# Patient Record
Sex: Male | Born: 1990 | Race: White | Hispanic: No | Marital: Single | Smoking: Current every day smoker
Health system: Southern US, Community
[De-identification: ages and names within clinical notes are randomized; demographics above are authoritative.]

## PROBLEM LIST (undated history)

## (undated) HISTORY — PX: APPENDECTOMY: SHX54

---
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
## 2010-06-15 ENCOUNTER — Emergency Department (HOSPITAL_COMMUNITY): Admission: EM | Admit: 2010-06-15 | Discharge: 2010-06-15 | Payer: Self-pay | Admitting: Family Medicine

## 2010-07-16 ENCOUNTER — Emergency Department (HOSPITAL_COMMUNITY)
Admission: EM | Admit: 2010-07-16 | Discharge: 2010-07-16 | Payer: Self-pay | Source: Home / Self Care | Admitting: Emergency Medicine

## 2010-12-24 NOTE — Discharge Summary (Signed)
Dennis Foley, Dennis Foley              ACCOUNT NO.:  [id number]   MEDICAL RECORD NO.:  [id number]          PATIENT TYPE:  INP   LOCATION:  6121                         FACILITY:  [facility]   PHYSICIAN:  Prabhakar D. Pendse, M.D.DATE OF BIRTH:  01/30/1991   DATE OF ADMISSION:  07/27/2005  DATE OF DISCHARGE:  07/29/2005                                 DISCHARGE SUMMARY   CHIEF COMPLAINT:  Acute appendicitis.   HOSPITAL COURSE:  Herbert is a 20 year old with no significant past medical  history who was referred from an Urgent [facility] Center for concern for acute  appendicitis.  The patient complained of right-sided abdominal pain and  anorexia that had worsened over the past three days prior to admission.  His  white blood cell count was 14.4, with 75% segs.  He went to the operating  room for an exploratory laparotomy and was found to have a nonperforated  acute appendicitis and received two days of Unasyn IV antibiotics  postoperatively.  Prior to discharge, the patient was afebrile, tolerating  p.o. pain medications as well as a p.o. diet and was advanced to a full  diet.   PROCEDURE:  Exploratory laparotomy with appendectomy on July 27, 2005.   FINAL DIAGNOSIS:  Acute nonperforated appendicitis.   DISCHARGE MEDICATIONS:  Tylenol No. 3, one to two tablets p.o. q.6h. p.r.n.  pain.  Given #12 with no refills.   DISCHARGE INSTRUCTIONS:  The patient's family was instructed to return to  the emergency department for purulent drainage from incision site, fever  greater than 102, or any other concerns.   FOLLOW UP:  They were to follow up with Dr. Levie Heritage in three weeks and given  the number [phone] to call to schedule that appointment secondary to the  office being closed today.   DISCHARGE DATA:  Discharge weight 62.2 kg.   CONDITION ON DISCHARGE:  Good.     ______________________________  Pediatrics Resident    ______________________________  Hyman Bible. Levie Heritage, M.D.    PR/MEDQ  D:  07/29/2005  T:  08/01/2005  Job:  [id number]   cc:   Donnella Bi D. Pendse, M.D.  Fax: [phone]

## 2010-12-24 NOTE — Op Note (Signed)
NAMEJAKOBI, Dennis Foley              ACCOUNT NO.:  [id number]   MEDICAL RECORD NO.:  [id number]          PATIENT TYPE:  OBV   LOCATION:  2550                         FACILITY:  [facility]   PHYSICIAN:  Prabhakar D. Pendse, M.D.DATE OF BIRTH:  10-03-1990   DATE OF PROCEDURE:  07/27/2005  DATE OF DISCHARGE:                                 OPERATIVE REPORT   PREOPERATIVE DIAGNOSIS:  Acute appendicitis.   POSTOPERATIVE DIAGNOSIS:  Acute appendicitis without gross perforation.   OPERATION PERFORMED:  Exploratory laparotomy and appendectomy.   SURGEON:  Prabhakar D. Levie Heritage, M.D.   ASSISTANT:  Nurse.   ANESTHESIA:  Nurse.   OPERATIVE INDICATIONS:  This 20 year old boy was referred by Dr. Coralee Rud from [facility] on [address] with  about 60 hours history of progressively worse right-sided abdominal pains  associated with anorexia.  There was no history of vomiting, no URI.   Physical examination was consistent with localizing right lower quadrant  tenderness with rebound.  The white count was 14,400 with 75% neutrophils.  Urinalysis was normal.  Clinical diagnosis of acute appendicitis was made.  The patient was taken to the operating room.   OPERATIVE FINDINGS:  Upon opening the peritoneal cavity, there was small  quantity of straw-colored fluid in the right lower quadrant area.  Appendix  itself was about 4 inches long, markedly distended, indurated and omentum  wrapped around the tip of the appendix. There was no evidence of gross  perforation.  Limited examination of the cecum and the terminal ileum showed  no other abnormalities.   OPERATIVE PROCEDURE:  Under satisfactory general endotracheal anesthesia,  the patient in supine position, abdomen was thoroughly prepped and draped in  the usual manner. About 2 inch long transverse incision was made in the  right lower quadrant area.  Skin and subcutaneous tissue incised. Bleeders  individually  clamped, cut and electrocoagulated.  Muscles incised in the  McBurney fashion, peritoneal cavity entered. The findings were as described  above. At this time, by digital exploration, appendix was exteriorized  partially.  Appendicular attachments were serially clamped, cut and ligated  with 2-0 silk.  Appendiceal base was clearly identified.  Appendectomy done  in the routine fashion. The stump was buried in the cecal wall with 3-0 silk  pursestring suture. Hemostasis was accomplished.  The area was irrigated.  Sponge and needle count being correct. Peritoneum closed with 2-0 Vicryl  running interlocking sutures. Wound was irrigated again.  Muscles closed  with 2-0 Vicryl interrupted sutures,  subcutaneous tissue with 2-0 Vicryl, skin closed with 4-0 Monocryl  subcuticular sutures. Steri-Strips applied. Appropriate dressing applied.  Throughout the procedure the patient's vital signs remained stable. The  patient withstood the procedure well and was transferred to recovery room in  satisfactory general condition.           ______________________________  Hyman Bible Levie Heritage, M.D.     PDP/MEDQ  D:  07/27/2005  T:  07/29/2005  Job:  [id number]   cc:   Ricka Burdock, P.A.-C.  [facility]   David L. Reed Breech,  M.D.  Fax: [phone]

## 2011-02-02 ENCOUNTER — Inpatient Hospital Stay (INDEPENDENT_AMBULATORY_CARE_PROVIDER_SITE_OTHER)
Admission: RE | Admit: 2011-02-02 | Discharge: 2011-02-02 | Disposition: A | Discharge status: Home / Self Care | Payer: 59 | Source: Ambulatory Visit | Attending: Family Medicine | Admitting: Family Medicine

## 2011-02-02 DIAGNOSIS — J029 Acute pharyngitis, unspecified: Secondary | ICD-10-CM

## 2011-07-21 ENCOUNTER — Encounter: Payer: Self-pay | Admitting: Emergency Medicine

## 2011-07-21 ENCOUNTER — Emergency Department (INDEPENDENT_AMBULATORY_CARE_PROVIDER_SITE_OTHER)
Admission: EM | Admit: 2011-07-21 | Discharge: 2011-07-21 | Disposition: A | Discharge status: Home / Self Care | Payer: 59 | Source: Home / Self Care | Attending: Family Medicine | Admitting: Family Medicine

## 2011-07-21 DIAGNOSIS — J029 Acute pharyngitis, unspecified: Secondary | ICD-10-CM

## 2011-07-21 DIAGNOSIS — R05 Cough: Secondary | ICD-10-CM

## 2011-07-21 DIAGNOSIS — R059 Cough, unspecified: Secondary | ICD-10-CM

## 2011-07-21 LAB — POCT RAPID STREP A: Streptococcus, Group A Screen (Direct): NEGATIVE

## 2011-07-21 MED ORDER — GUAIFENESIN-CODEINE 100-10 MG/5ML PO SYRP: 5.0000 mL | ORAL_SOLUTION | Freq: Four times a day (QID) | ORAL | Status: AC | PRN

## 2011-07-21 NOTE — ED Notes (Signed)
Pt here with cold/cough sx that started x 1 wk ago.sx cough with green mocous,chest congestion and itchy throat.pt has not tried otc meds.no fevers reported

## 2011-07-21 NOTE — ED Provider Notes (Signed)
History     CSN: [id number] Arrival date & time: 07/21/2011 10:25 AM   First MD Initiated Contact with Patient 07/21/11 1038      Chief Complaint  Patient presents with  . URI    (Consider location/radiation/quality/duration/timing/severity/associated sxs/prior treatment) HPI Comments: Dennis Foley presents for evaluation of persistent cough and sore throat. He denies fever but reports hx of bronchitis last year and a remote hx of pneumonia. He smokes several cigarettes daily.   Patient is a 20 y.o. male presenting with cough. The history is provided by the patient.  Cough This is a new problem. The current episode started more than 2 days ago. The problem occurs constantly. The problem has not changed since onset.The cough is non-productive. There has been no fever. Associated symptoms include sore throat and myalgias. He has tried nothing for the symptoms. He is a smoker. His past medical history is significant for bronchitis and pneumonia.    History reviewed. No pertinent past medical history.  Past Surgical History  Procedure Date  . Appendectomy     History reviewed. No pertinent family history.  History  Substance Use Topics  . Smoking status: Current Everyday Smoker  . Smokeless tobacco: Not on file  . Alcohol Use: Yes      Review of Systems  Constitutional: Negative for fever.  HENT: Positive for sore throat.   Eyes: Negative.   Respiratory: Positive for cough.   Cardiovascular: Negative.   Gastrointestinal: Negative.   Genitourinary: Negative.   Musculoskeletal: Positive for myalgias.  Skin: Negative.   Neurological: Negative.     Allergies  Review of patient's allergies indicates no known allergies.  Home Medications   Current Outpatient Rx  Name Route Sig Dispense Refill  . GUAIFENESIN-CODEINE 100-10 MG/5ML PO SYRP Oral Take 5 mLs by mouth every 6 (six) hours as needed for cough or congestion. 120 mL 0    BP 129/80  Pulse 70  Temp(Src) 98.2 F  (36.8 C) (Oral)  Resp 20  SpO2 99%  Physical Exam  Nursing note and vitals reviewed. Constitutional: He appears well-developed and well-nourished.  HENT:  Head: Normocephalic and atraumatic.  Right Ear: Tympanic membrane and external ear normal.  Left Ear: Tympanic membrane and external ear normal.  Mouth/Throat: Uvula is midline, oropharynx is clear and moist and mucous membranes are normal. No oropharyngeal exudate, posterior oropharyngeal edema or posterior oropharyngeal erythema.    Eyes: Conjunctivae and EOM are normal. Pupils are equal, round, and reactive to light.  Neck: Normal range of motion.  Cardiovascular: Normal rate and regular rhythm.   Pulmonary/Chest: Effort normal and breath sounds normal. He has no wheezes. He has no rhonchi. He has no rales.  Lymphadenopathy:    He has no cervical adenopathy.  Skin: Skin is warm and dry.    ED Course  Procedures (including critical care time)   Labs Reviewed  POCT RAPID STREP A ([facility] ONLY)   No results found.   1. Cough   2. Pharyngitis       MDM  Rapid strep: negative        Richardo Priest, MD 07/21/11 1131

## 2014-07-21 ENCOUNTER — Ambulatory Visit (INDEPENDENT_AMBULATORY_CARE_PROVIDER_SITE_OTHER): Payer: BC Managed Care – PPO | Admitting: Physician Assistant

## 2014-07-21 VITALS — BP 138/100 | HR 70 | Temp 98.8°F | Resp 16 | Ht 65.5 in | Wt 152.0 lb

## 2014-07-21 DIAGNOSIS — Z91048 Other nonmedicinal substance allergy status: Secondary | ICD-10-CM

## 2014-07-21 DIAGNOSIS — R05 Cough: Secondary | ICD-10-CM

## 2014-07-21 DIAGNOSIS — Z9109 Other allergy status, other than to drugs and biological substances: Secondary | ICD-10-CM

## 2014-07-21 DIAGNOSIS — R059 Cough, unspecified: Secondary | ICD-10-CM

## 2014-07-21 DIAGNOSIS — R0981 Nasal congestion: Secondary | ICD-10-CM

## 2014-07-21 MED ORDER — BENZONATATE 100 MG PO CAPS: 100.0000 mg | ORAL_CAPSULE | Freq: Three times a day (TID) | ORAL | Status: DC | PRN

## 2014-07-21 MED ORDER — ALBUTEROL SULFATE HFA 108 (90 BASE) MCG/ACT IN AERS: 2.0000 | INHALATION_SPRAY | RESPIRATORY_TRACT | Status: DC | PRN

## 2014-07-21 MED ORDER — FLUTICASONE PROPIONATE 50 MCG/ACT NA SUSP: 2.0000 | Freq: Every day | NASAL | Status: DC

## 2014-07-21 MED ORDER — CETIRIZINE HCL 10 MG PO TABS: 10.0000 mg | ORAL_TABLET | Freq: Every day | ORAL | Status: DC

## 2014-07-21 NOTE — Patient Instructions (Addendum)
I think your ongoing cough is most likely due to allergies causing congestion that is dripping down the back of your throat.  Please take the zyrtec once daily. Please do 2 sprays in each nostril once daily for congestion. An old chest xray from 2011 showed that you might have asthma. Please use the albuterol inhaler every 4 hours as needed for shortness of breath and cough.  If your cough doesn't resolve in the next 2-3 weeks with these measures, please return to clinic for further workup and possible chest xray at that time.  Stopping smoking will help with the cough!  Please take the tessalon every 8 hours as needed for the cough.  If you start to have fevers, chills, or cough up blood, or your cough gets worse, please return to clinic asap.

## 2014-07-21 NOTE — Progress Notes (Signed)
Subjective:    Patient ID: Dennis MinaWeston Philipps, male    DOB: 1991-02-24, 24 y.o.   MRN: [id number]  PCP: No PCP Per Patient  Chief Complaint  Patient presents with  . Cough    productive cough for about 1 month now  . Chest Pain    pt describes it as chest tightness   There are no active problems to display for this patient.  Prior to Admission medications   Medication Sig Start Date End Date Taking? Authorizing Provider  albuterol (PROVENTIL HFA;VENTOLIN HFA) 108 (90 BASE) MCG/ACT inhaler Inhale 2 puffs into the lungs every 4 (four) hours as needed for wheezing or shortness of breath (cough, shortness of breath or wheezing.). 07/21/14   Raelyn Ensignodd Saroya Riccobono, PA  benzonatate (TESSALON) 100 MG capsule Take 1-2 capsules (100-200 mg total) by mouth 3 (three) times daily as needed for cough. 07/21/14   Raelyn Ensignodd Eliott Amparan, PA  cetirizine (ZYRTEC) 10 MG tablet Take 1 tablet (10 mg total) by mouth daily. 07/21/14   Shakendra Griffeth, PA  fluticasone (FLONASE) 50 MCG/ACT nasal spray Place 2 sprays into both nostrils daily. 07/21/14   Raelyn Ensignodd Christee Mervine, PA   Medications, allergies, past medical history, surgical history, family history, social history and problem list reviewed and updated.  HPI  23 yom cigarette and marijuana smoker presents with one month hx cough.   He states this is his third time going to a provider for care. He was seen about one month ago for congestion and cough. Tx with augmentin, and given albuterol inhaler and flonase. He states that his cough persisted despite these tx. The abx didn't help, he is unsure if the inhaler helped, and he thinks the flonase did help but he stopped using it after a few days. He went to another [facility] after this initial visit and had a neg strep test and states he was told he had a viral uri.   Today he states he is still having a cough. Non-prod. He doesn't awake with the cough, and it worsens throughout the day. He also is having some head and nasal congestion which has  been present for approx one month, but the most bothersome thing to him is the cough. Feels like he has a drip in back of his throat. Denies sore throat, otalgia, abd pain, n/v, diarrhea. Denies fever, chills, night sweats, unintentional wt loss. He denies hx wheezing or allergies. Denies hx acid reflux.   He has been working 80-85 hrs week last month, some of this time at a [employer] for past month.   Chest xray from 2011 shows hyperinflation with question of possible asthma.   Review of Systems No CP, SOB.     Objective:   Physical Exam  Constitutional: He is oriented to person, place, and time. He appears well-developed and well-nourished.  Non-toxic appearance. He does not have a sickly appearance. He does not appear ill. No distress.  BP 138/100 mmHg  Pulse 70  Temp(Src) 98.8 F (37.1 C) (Oral)  Resp 16  Ht 5' 5.5" (1.664 m)  Wt 152 lb (68.947 kg)  BMI 24.90 kg/m2  SpO2 99%   HENT:  Right Ear: Tympanic membrane is not erythematous, not retracted and not bulging. A middle ear effusion is present.  Left Ear: Tympanic membrane is not erythematous, not retracted and not bulging. A middle ear effusion is present.  Nose: Mucosal edema and rhinorrhea present. Right sinus exhibits no maxillary sinus tenderness and no frontal sinus tenderness. Left sinus exhibits  no maxillary sinus tenderness and no frontal sinus tenderness.  Mouth/Throat: Uvula is midline, oropharynx is clear and moist and mucous membranes are normal. No oropharyngeal exudate, posterior oropharyngeal edema or posterior oropharyngeal erythema.  Cardiovascular: Normal rate, regular rhythm, S1 normal, S2 normal and normal heart sounds.  Exam reveals no gallop.   No murmur heard. Pulmonary/Chest: Effort normal. No tachypnea. He has no decreased breath sounds. He has no wheezes. He has no rhonchi. He has no rales.  Lymphadenopathy:       Head (right side): No submental, no submandibular and no tonsillar adenopathy  present.       Head (left side): No submental, no submandibular and no tonsillar adenopathy present.    He has no cervical adenopathy.  Neurological: He is alert and oriented to person, place, and time.  Psychiatric: He has a normal mood and affect. His speech is normal.      Assessment & Plan:   23 yom cigarette and marijuana smoker presents with one month hx cough.   Cough - Plan: albuterol (PROVENTIL HFA;VENTOLIN HFA) 108 (90 BASE) MCG/ACT inhaler, benzonatate (TESSALON) 100 MG capsule --has been present for one month, exam benign today, vitals normal other than mildly elevated bp --pt has sensation of post nasal drip --cough most likely due to allergies or asthma --allergies - flonase, zyrtec --asthma - albuterol as needed --no constitutional sx --rtc 2-3 wks if not resolving, poss cxr at that time --encouraged smoking cessation  Environmental allergies - Plan: fluticasone (FLONASE) 50 MCG/ACT nasal spray, cetirizine (ZYRTEC) 10 MG tablet --could be contributing to cough --zyrtec, flonase  Head congestion - Plan: fluticasone (FLONASE) 50 MCG/ACT nasal spray, cetirizine (ZYRTEC) 10 MG tablet --most likely causing post nasal drip that is causing cough --flonase  Donnajean Lopesodd M. Fidencio Duddy, PA-C Physician Assistant-Certified [facility]  07/21/2014 7:30 PM

## 2014-09-17 ENCOUNTER — Ambulatory Visit (INDEPENDENT_AMBULATORY_CARE_PROVIDER_SITE_OTHER): Payer: BLUE CROSS/BLUE SHIELD | Admitting: Physician Assistant

## 2014-09-17 VITALS — BP 124/92 | HR 86 | Temp 98.5°F | Resp 16 | Ht 65.25 in | Wt 158.0 lb

## 2014-09-17 DIAGNOSIS — A64 Unspecified sexually transmitted disease: Secondary | ICD-10-CM

## 2014-09-17 DIAGNOSIS — R300 Dysuria: Secondary | ICD-10-CM

## 2014-09-17 LAB — POCT URINALYSIS DIPSTICK
Bilirubin, UA: NEGATIVE
Blood, UA: NEGATIVE
Glucose, UA: NEGATIVE
Leukocytes, UA: NEGATIVE
Nitrite, UA: NEGATIVE
PH UA: 6
Protein, UA: NEGATIVE
Spec Grav, UA: 1.025
Urobilinogen, UA: 0.2

## 2014-09-17 LAB — POCT UA - MICROSCOPIC ONLY
Casts, Ur, LPF, POC: NEGATIVE
Crystals, Ur, HPF, POC: NEGATIVE
Mucus, UA: NEGATIVE
Yeast, UA: NEGATIVE

## 2014-09-17 MED ORDER — CEFTRIAXONE SODIUM 1 G IJ SOLR
250.0000 mg | Freq: Once | INTRAMUSCULAR | Status: AC
  Administered 2014-09-17: 250 mg via INTRAMUSCULAR

## 2014-09-17 MED ORDER — AZITHROMYCIN 500 MG PO TABS: 1000.0000 mg | ORAL_TABLET | Freq: Every day | ORAL | Status: DC

## 2014-09-17 NOTE — Progress Notes (Signed)
09/17/2014 at 6:52 PM  Dennis Foley / DOB: [birth year] / MRN: [id number]  The patient  does not have a problem list on file.  SUBJECTIVE  Chief compalaint: Dysuria; Urinary Frequency; and Penile Discharge   History of present illness: Dennis Foley is 24 y.o. well appearing male presenting for dysuria, frequency and discharge. He denies a history of UTI.  Onset was 7 days ago, with worsening course since that time. He reports this starting after unprotected anal sex with an ex girlfriend. He did not use any protection.  He also reports two other active partners and does not use protection with them either.  Associated symptoms include prostatic pressure that comes and goes and he denies any lesions and inguinal tenderness. He has tried hydrating more with poor relief  He  has no past medical history on file.    He has a current medication list which includes the following prescription(s): albuterol, benzonatate, cetirizine, and fluticasone.  Dennis Foley has No Known Allergies. He  reports that he has been smoking Cigarettes.  He has a 2.5 pack-year smoking history. He does not have any smokeless tobacco history on file. He reports that he drinks alcohol. He reports that he uses illicit drugs (Marijuana and Cocaine). He  has no sexual activity history on file.  The patient  has past surgical history that includes Appendectomy.  His family history is not on file.  Review of Systems  Constitutional: Negative.   HENT: Negative.   Respiratory: Negative.   Cardiovascular: Negative.   Gastrointestinal: Negative for nausea, vomiting and abdominal pain.  Genitourinary: Positive for dysuria, urgency and frequency. Negative for hematuria and flank pain.  Skin: Negative.   Neurological: Negative for dizziness.    OBJECTIVE  His  height is 5' 5.25" (1.657 m) and weight is 158 lb (71.668 kg). His oral temperature is 98.5 F (36.9 C). His blood pressure is 124/92 and his pulse is 86. His  respiration is 16 and oxygen saturation is 96%.  The patient's body mass index is 26.1 kg/(m^2).  Physical Exam  Constitutional: He is oriented to person, place, and time. He appears well-developed and well-nourished.  Cardiovascular: Normal rate.   Respiratory: Effort normal.  GI: Soft. Bowel sounds are normal. He exhibits no distension and no mass. There is no tenderness. There is no rebound and no guarding. No hernia. Hernia confirmed negative in the right inguinal area and confirmed negative in the left inguinal area.  Genitourinary: Penis normal. Right testis shows no swelling and no tenderness. Right testis is descended. Left testis shows tenderness. Left testis shows no swelling. Left testis is descended. No phimosis, paraphimosis, hypospadias, penile erythema or penile tenderness. No discharge found.  Musculoskeletal: Normal range of motion.  Lymphadenopathy:       Right: No inguinal adenopathy present.       Left: No inguinal adenopathy present.  Neurological: He is alert and oriented to person, place, and time.  Skin: Skin is warm and dry.  Psychiatric: He has a normal mood and affect.    Results for orders placed or performed in visit on 09/17/14 (from the past 24 hour(s))  POCT urinalysis dipstick     Status: None   Collection Time: 09/17/14  6:32 PM  Result Value Ref Range   Color, UA yellow    Clarity, UA clear    Glucose, UA neg    Bilirubin, UA neg    Ketones, UA trace    Spec Grav, UA 1.025  Blood, UA neg    pH, UA 6.0    Protein, UA neg    Urobilinogen, UA 0.2    Nitrite, UA neg    Leukocytes, UA Negative   POCT UA - Microscopic Only     Status: None   Collection Time: 09/17/14  6:32 PM  Result Value Ref Range   WBC, Ur, HPF, POC 8-12    RBC, urine, microscopic 0-3    Bacteria, U Microscopic trace    Mucus, UA neg    Epithelial cells, urine per micros 1-3    Crystals, Ur, HPF, POC neg    Casts, Ur, LPF, POC neg    Yeast, UA neg     ASSESSMENT &  PLAN  Dennis Foley was seen today for dysuria, urinary frequency and penile discharge.  Diagnoses and all orders for this visit:  Dysuria: Patient appears very promiscuous. Will cover for GC/Chlamydia and await lab work. Will culture urine to further investigate possibility of UTI and will treat accordingly.  Orders: -     POCT urinalysis dipstick -     POCT UA - Microscopic Only -     GC/Chlamydia Probe Amp -     HIV antibody -     RPR -     Hepatitis B surface antigen -     Hepatitis B surface antibody -     Urine culture  Sexually transmitted disease Orders: -     cefTRIAXone (ROCEPHIN) injection 250 mg; Inject 0.25 g (250 mg total) into the muscle once. -     azithromycin (ZITHROMAX) 500 MG tablet; Take 2 tablets (1,000 mg total) by mouth daily.    The patient was advised to call or come back to clinic if he does not see an improvement in symptoms, or worsens with the above plan.   Deliah BostonMichael Yovana Scogin, MHS, PA-C [facility] 09/17/2014 6:52 PM

## 2014-09-18 LAB — HIV ANTIBODY (ROUTINE TESTING W REFLEX): HIV 1&2 Ab, 4th Generation: NONREACTIVE

## 2014-09-18 LAB — HEPATITIS B SURFACE ANTIGEN: Hepatitis B Surface Ag: NEGATIVE

## 2014-09-18 LAB — RPR

## 2014-09-18 LAB — HEPATITIS B SURFACE ANTIBODY, QUANTITATIVE: Hep B S AB Quant (Post): 0.1 m[IU]/mL

## 2014-09-19 LAB — URINE CULTURE
Colony Count: NO GROWTH
Organism ID, Bacteria: NO GROWTH

## 2014-09-19 LAB — GC/CHLAMYDIA PROBE AMP
CT Probe RNA: POSITIVE — AB
GC Probe RNA: NEGATIVE

## 2014-10-23 ENCOUNTER — Ambulatory Visit (INDEPENDENT_AMBULATORY_CARE_PROVIDER_SITE_OTHER): Payer: BLUE CROSS/BLUE SHIELD | Admitting: Sports Medicine

## 2014-10-23 VITALS — BP 120/88 | HR 68 | Temp 98.2°F | Resp 16 | Ht 66.0 in | Wt 155.0 lb

## 2014-10-23 DIAGNOSIS — A64 Unspecified sexually transmitted disease: Secondary | ICD-10-CM | POA: Diagnosis not present

## 2014-10-23 DIAGNOSIS — R300 Dysuria: Secondary | ICD-10-CM | POA: Diagnosis not present

## 2014-10-23 LAB — POCT URINALYSIS DIPSTICK
Bilirubin, UA: NEGATIVE
GLUCOSE UA: NEGATIVE
Ketones, UA: NEGATIVE
Leukocytes, UA: NEGATIVE
Nitrite, UA: NEGATIVE
RBC UA: NEGATIVE
Spec Grav, UA: >=1.03
Urobilinogen, UA: 0.2
pH, UA: 5.5

## 2014-10-23 LAB — POCT UA - MICROSCOPIC ONLY
Casts, Ur, LPF, POC: NEGATIVE
Crystals, Ur, HPF, POC: NEGATIVE
Yeast, UA: NEGATIVE

## 2014-10-23 MED ORDER — CEFTRIAXONE SODIUM 1 G IJ SOLR
250.0000 mg | Freq: Once | INTRAMUSCULAR | Status: AC
  Administered 2014-10-23: 250 mg via INTRAMUSCULAR

## 2014-10-23 MED ORDER — DOXYCYCLINE HYCLATE 100 MG PO TABS: 100.0000 mg | ORAL_TABLET | Freq: Two times a day (BID) | ORAL | Status: DC

## 2014-10-23 NOTE — Progress Notes (Signed)
Dennis Foley - 24 y.o. male MRN [id number]  Date of birth: [birth year]  SUBJECTIVE: Chief Complaint  Patient presents with  . Testicle Pain    x 2 weeks    HPI:   2 weeks of generalized bilateral testicular pain  Left-sided varicosity previously  Denies any debilitating pain however generalized discomfort especially after sitting for a prolonged time.  Recently treated for STI. Reports partner treated as well.  Has penile piercing that was just replaced within the past 3 days.  Denies any fevers, chills, night sweats.  No nausea, vomiting  No masses or prior hernias      ROS: per HPI    HISTORY:  Past Medical, Surgical, Social, and Family History reviewed & updated per EMR.  Pertinent Historical Findings include:  reports that he has been smoking Cigarettes.  He has a 2.5 pack-year smoking history. He does not have any smokeless tobacco history on file. Recent chlamydial infection s/p monotherapy azithromycin Prior varicosities  OBJECTIVE:  VS:   HT:5\' 6"  (167.6 cm)   WT:155 lb (70.308 kg)  BMI:25.1          BP:120/88 mmHg  HR:68bpm  TEMP:98.2 F (36.8 C)(Oral)  RESP:99 %  Physical Exam  Constitutional: He is well-developed, well-nourished, and in no distress. No distress.  HENT:  Head: Normocephalic and atraumatic.  Eyes: Right eye exhibits no discharge. Left eye exhibits no discharge. No scleral icterus.  Pulmonary/Chest: Effort normal. No respiratory distress.  Genitourinary: He exhibits abnormal scrotal mass (Left-sided varicosities) and epididymal tenderness (Bilateral slightly more than expected but not significant TTP). He exhibits no abnormal testicular mass, no testicular tenderness and no scrotal tenderness. Penis exhibits no lesions (transurethral penile piercing Dennis Foley)) and no edema. No discharge found.  Rectal exam deferred per patient request   Skin: He is not diaphoretic.  Psychiatric: Mood, memory, affect and judgment normal.     DATA  OBTAINED DURING VISIT: Results for orders placed or performed in visit on 10/23/14  GC/Chlamydia Probe Amp  Result Value Ref Range   CT Probe RNA NEGATIVE    GC Probe RNA NEGATIVE   Urine culture  Result Value Ref Range   Colony Count NO GROWTH    Organism ID, Bacteria NO GROWTH   POCT urinalysis dipstick  Result Value Ref Range   Color, UA yellow    Clarity, UA clear    Glucose, UA neg    Bilirubin, UA neg    Ketones, UA neg    Spec Grav, UA >=1.030    Blood, UA neg    pH, UA 5.5    Protein, UA n eg    Urobilinogen, UA 0.2    Nitrite, UA neg    Leukocytes, UA Negative   POCT UA - Microscopic Only  Result Value Ref Range   WBC, Ur, HPF, POC 1-3    RBC, urine, microscopic 1-2    Bacteria, U Microscopic trace    Mucus, UA trace    Epithelial cells, urine per micros 1-2    Crystals, Ur, HPF, POC neg    Casts, Ur, LPF, POC neg    Yeast, UA neg     ASSESSMENT: 1. Dysuria   2. Sexually transmitted disease    Concerns for potential prostatitis. Given exposure and recent infection secondary coverage with 14 days of doxycycline & 1 dose of Rocephin indicated.   PLAN: See problem based charting & AVS for additional documentation.  14 days doxycycline  Discussed importance of abstinence until completed treatment  course as well as partner being completely treated.  Recommend removal and sterilization of piercings until completed course  > Consider: Referral to urology if not significantly improving & altered anatomy. > Return if symptoms worsen or fail to improve.

## 2014-10-23 NOTE — Patient Instructions (Signed)
Chlamydia Chlamydia is an infection. It is spread through sexual contact. Chlamydia can be in different areas of the body. These areas include the urethra, throat, or rectum. It is important to treat chlamydia as soon as possible. It can damage other organs.  CAUSES  Chlamydia is caused by bacteria. It is a sexually transmitted disease. This means that it is passed from an infected partner during intimate contact. This contact could be with the genitals, mouth, or rectal area.  SIGNS AND SYMPTOMS  There may not be any symptoms. This is often the case early in the infection. If there are symptoms, they are usually mild and may only be noticeable in the morning. Symptoms you may notice include:   Burning with urination.  Pain or swelling in the testicles.  Watery mucus-like discharge from the penis.  Long-standing (chronic) pelvic pain after frequent infections.  Pain, swelling, or itching around the anus.  A sore throat.  Itching, burning, or redness in the eyes, or discharge from the eyes. DIAGNOSIS  To diagnose this infection, your health care provider will do a pelvic exam. A sample of urine or a swab from the rectum may be taken for testing.  TREATMENT  Chlamydia is treated with antibiotic medicines.  HOME CARE INSTRUCTIONS  Take your antibiotic medicine as directed by your health care provider. Finish the antibiotic even if you start to feel better. Incomplete treatment will put you at risk for not being able to have children (sterility).   Take medicines only as directed by your health care provider.   Rest.   Inform any sexual partners about your infection. Even if they are symptom free or have a negative culture or evaluation, they should be treated for the condition.   Do not have sex (intercourse) until treatment is completed and your health care provider says it is okay.   Keep all follow-up visits as directed by your health care provider.   Not all test results  are available during your visit. If your test results are not back during the visit, make an appointment with your health care provider to find out the results. Do not assume everything is normal if you have not heard from your health care provider or the medical facility. It is your responsibility to get your test results. SEEK MEDICAL CARE IF:  You develop new joint pain.  You have a fever. SEEK IMMEDIATE MEDICAL CARE IF:   Your pain increases.   You have abnormal discharge.   You have pain during intercourse. MAKE SURE YOU:   Understand these instructions.  Will watch your condition.  Will get help right away if you are not doing well or get worse. Document Released: 07/25/2005 Document Revised: 12/09/2013 Document Reviewed: 01/31/2013 [facility] Patient Information 2015 [place]. This information is not intended to replace advice given to you by your health care provider. Make sure you discuss any questions you have with your health care provider.   Prostatitis The prostate gland is about the size and shape of a walnut. It is located just below your bladder. It produces one of the components of semen, which is made up of sperm and the fluids that help nourish and transport it out from the testicles. Prostatitis is inflammation of the prostate gland.  There are four types of prostatitis:  Acute bacterial prostatitis. This is the least common type of prostatitis. It starts quickly and usually is associated with a bladder infection, high fever, and shaking chills. It can occur at  any age.  Chronic bacterial prostatitis. This is a persistent bacterial infection in the prostate. It usually develops from repeated acute bacterial prostatitis or acute bacterial prostatitis that was not properly treated. It can occur in men of any age but is most common in middle-aged men whose prostate has begun to enlarge. The symptoms are not as severe as those in acute bacterial prostatitis.  Discomfort in the part of your body that is in front of your rectum and below your scrotum (perineum), lower abdomen, or in the head of your penis (glans) may represent your primary discomfort.  Chronic prostatitis (nonbacterial). This is the most common type of prostatitis. It is inflammation of the prostate gland that is not caused by a bacterial infection. The cause is unknown and may be associated with a viral infection or autoimmune disorder.  Prostatodynia (pelvic floor disorder). This is associated with increased muscular tone in the pelvis surrounding the prostate. CAUSES The causes of bacterial prostatitis are bacterial infection. The causes of the other types of prostatitis are unknown.  SYMPTOMS  Symptoms can vary depending upon the type of prostatitis that exists. There can also be overlap in symptoms. Possible symptoms for each type of prostatitis are listed below. Acute Bacterial Prostatitis  Painful urination.  Fever or chills.  Muscle or joint pains.  Low back pain.  Low abdominal pain.  Inability to empty bladder completely. Chronic Bacterial Prostatitis, Chronic Nonbacterial Prostatitis, and Prostatodynia  Sudden urge to urinate.  Frequent urination.  Difficulty starting urine stream.  Weak urine stream.  Discharge from the urethra.  Dribbling after urination.  Rectal pain.  Pain in the testicles, penis, or tip of the penis.  Pain in the perineum.  Problems with sexual function.  Painful ejaculation.  Bloody semen. DIAGNOSIS  In order to diagnose prostatitis, your health care provider will ask about your symptoms. One or more urine samples will be taken and tested (urinalysis). If the urinalysis result is negative for bacteria, your health care provider may use a finger to feel your prostate (digital rectal exam). This exam helps your health care provider determine if your prostate is swollen and tender. It will also produce a specimen of semen that  can be analyzed. TREATMENT  Treatment for prostatitis depends on the cause. If a bacterial infection is the cause, it can be treated with antibiotic medicine. In cases of chronic bacterial prostatitis, the use of antibiotics for up to 1 month or 6 weeks may be necessary. Your health care provider may instruct you to take sitz baths to help relieve pain. A sitz bath is a bath of hot water in which your hips and buttocks are under water. This relaxes the pelvic floor muscles and often helps to relieve the pressure on your prostate. HOME CARE INSTRUCTIONS   Take all medicines as directed by your health care provider.  Take sitz baths as directed by your health care provider. SEEK MEDICAL CARE IF:   Your symptoms get worse, not better.  You have a fever. SEEK IMMEDIATE MEDICAL CARE IF:   You have chills.  You feel nauseous or vomit.  You feel lightheaded or faint.  You are unable to urinate.  You have blood or blood clots in your urine. MAKE SURE YOU:  Understand these instructions.  Will watch your condition.  Will get help right away if you are not doing well or get worse. Document Released: 07/22/2000 Document Revised: 07/30/2013 Document Reviewed: 02/11/2013 [facility] Patient Information 2015 [place], [place]. This information is  not intended to replace advice given to you by your health care provider. Make sure you discuss any questions you have with your health care provider.

## 2014-10-25 LAB — GC/CHLAMYDIA PROBE AMP
CT PROBE, AMP APTIMA: NEGATIVE
GC PROBE AMP APTIMA: NEGATIVE

## 2014-10-25 LAB — URINE CULTURE
Colony Count: NO GROWTH
Organism ID, Bacteria: NO GROWTH

## 2014-11-04 ENCOUNTER — Encounter: Payer: Self-pay | Admitting: Sports Medicine

## 2016-07-12 ENCOUNTER — Ambulatory Visit (INDEPENDENT_AMBULATORY_CARE_PROVIDER_SITE_OTHER): Payer: BLUE CROSS/BLUE SHIELD | Admitting: Physician Assistant

## 2016-07-12 VITALS — BP 122/76 | HR 63 | Temp 98.5°F | Resp 16 | Ht 65.0 in | Wt 168.0 lb

## 2016-07-12 DIAGNOSIS — J069 Acute upper respiratory infection, unspecified: Secondary | ICD-10-CM

## 2016-07-12 MED ORDER — AZITHROMYCIN 250 MG PO TABS: ORAL_TABLET | ORAL | 0 refills | Status: DC

## 2016-07-12 MED ORDER — CETIRIZINE-PSEUDOEPHEDRINE ER 5-120 MG PO TB12: 1.0000 | ORAL_TABLET | Freq: Two times a day (BID) | ORAL | 0 refills | Status: DC

## 2016-07-12 NOTE — Progress Notes (Signed)
07/12/2016 10:40 AM   DOB: [birth year] / MRN: [id number]  SUBJECTIVE:  Dennis Foley is a 25 y.o. male presenting for cough, nasal congestion and sore throat that started 3-4 days ago.  Associates chills that are both hot and cold along with some gingival pain. Denies fever, appetite changes, dysphagia.  He has tried dayquil and this did not really help.  His boss is sick along with one of his coworkers. No history of asthma.  He has a minimal history of smoking.   He has No Known Allergies.   He  has no past medical history on file.    He  reports that he has been smoking Cigarettes.  He has a 2.50 pack-year smoking history. His smokeless tobacco use includes Chew. He reports that he drinks alcohol. He reports that he uses drugs, including Marijuana and Cocaine. He  has no sexual activity history on file. The patient  has a past surgical history that includes Appendectomy.  His family history is not on file.  Review of Systems  Constitutional: Positive for malaise/fatigue. Negative for chills, diaphoresis and fever.  HENT: Positive for congestion and sore throat.   Respiratory: Positive for cough. Negative for hemoptysis, shortness of breath and wheezing.   Cardiovascular: Negative for chest pain.  Gastrointestinal: Negative for nausea.  Skin: Negative for rash.  Neurological: Negative for dizziness and weakness.  Endo/Heme/Allergies: Negative for polydipsia.    The problem list and medications were reviewed and updated by myself where necessary and exist elsewhere in the encounter.   OBJECTIVE:  BP 122/76 (BP Location: Right Arm, Patient Position: Sitting, Cuff Size: Normal)   Pulse 63   Temp 98.5 F (36.9 C) (Oral)   Resp 16   Ht 5\' 5"  (1.651 m)   Wt 168 lb (76.2 kg)   SpO2 100%   BMI 27.96 kg/m   Physical Exam  Constitutional: He is oriented to person, place, and time. He appears well-developed. He does not appear ill.  Eyes: Conjunctivae and EOM are normal. Pupils are  equal, round, and reactive to light.  Cardiovascular: Normal rate.   Pulmonary/Chest: Breath sounds normal. No respiratory distress. He has no wheezes. He has no rales. He exhibits no tenderness.  Abdominal: He exhibits no distension.  Musculoskeletal: Normal range of motion.  Neurological: He is alert and oriented to person, place, and time. No cranial nerve deficit. Coordination normal.  Skin: Skin is warm and dry. He is not diaphoretic.  Psychiatric: He has a normal mood and affect.  Nursing note and vitals reviewed.   No results found for this or any previous visit (from the past 72 hour(s)).  No results found.  ASSESSMENT AND PLAN  Vickki MuffWeston was seen today for cough, sore throat and nasal congestion.  Diagnoses and all orders for this visit:  Acute URI: No red flags. Vitals look normal and he has not had medication today. Likely viral.  Advised he hold abx until day 10 of total illness. Work note provided for today.  -     azithromycin (ZITHROMAX) 250 MG tablet; Take two on day on and one daily thereafter.  Do not fill until day ten of total illness. -     cetirizine-pseudoephedrine (ZYRTEC-D ALLERGY & CONGESTION) 5-120 MG tablet; Take 1 tablet by mouth 2 (two) times daily.    The patient is advised to call or return to clinic if he does not see an improvement in symptoms, or to seek the care of the closest emergency department if  he worsens with the above plan.   Deliah BostonMichael Benedicto Capozzi, MHS, PA-C [facility] 07/12/2016 10:40 AM

## 2016-07-12 NOTE — Patient Instructions (Addendum)
  Take zyrtec--D as stated on the package and continue taking your dayquil.  If not better in about 6-7 days okay to fill the antibiotic.    IF you received an x-ray today, you will receive an [facility]. Please contact [facility] at [phone] with questions or concerns regarding your invoice.   IF you received labwork today, you will receive an invoice from [facility]. Please contact [facility] at [phone] with questions or concerns regarding your invoice.   Our billing staff will not be able to assist you with questions regarding bills from these companies.  You will be contacted with the lab results as soon as they are available. The fastest way to get your results is to activate your My Chart account. Instructions are located on the last page of this paperwork. If you have not heard from us regarding the results in 2 weeks, please contact this office.

## 2017-03-21 ENCOUNTER — Ambulatory Visit: Payer: BLUE CROSS/BLUE SHIELD | Admitting: Family Medicine

## 2017-03-31 ENCOUNTER — Encounter: Payer: Self-pay | Admitting: Physician Assistant

## 2017-03-31 ENCOUNTER — Ambulatory Visit (INDEPENDENT_AMBULATORY_CARE_PROVIDER_SITE_OTHER): Payer: Self-pay | Admitting: Physician Assistant

## 2017-03-31 VITALS — BP 130/78 | HR 54 | Temp 98.5°F | Resp 18 | Ht 65.5 in | Wt 159.6 lb

## 2017-03-31 DIAGNOSIS — Z024 Encounter for examination for driving license: Secondary | ICD-10-CM

## 2017-03-31 NOTE — Patient Instructions (Signed)
     IF you received an x-ray today, you will receive an invoice from [facility]. Please contact [facility] at [phone] with questions or concerns regarding your invoice.   IF you received labwork today, you will receive an invoice from [facility]. Please contact [facility] at [phone] with questions or concerns regarding your invoice.   Our billing staff will not be able to assist you with questions regarding bills from these companies.  You will be contacted with the lab results as soon as they are available. The fastest way to get your results is to activate your My Chart account. Instructions are located on the last page of this paperwork. If you have not heard from us regarding the results in 2 weeks, please contact this office.     

## 2017-04-11 NOTE — Progress Notes (Signed)
[facility] [address], [place] [phone]  Date:  03/31/2017   Name:  Dennis Foley   DOB:  24-Nov-1990   MRN:  [id number]  PCP:  Patient, No Pcp Per    History of Present Illness:  Dennis Foley is a 26 y.o. male patient who presents to PCP with  Chief Complaint  Patient presents with  . Employment Physical    DOT      No complaints or concerns at this time Patient reports on dot that he has had marijuana use.  When asked how often he participates in cannabis use, he reports every day.  He reports this is not during his work.     There are no active problems to display for this patient.   History reviewed. No pertinent past medical history.  Past Surgical History:  Procedure Laterality Date  . APPENDECTOMY      Social History  Substance Use Topics  . Smoking status: Current Every Day Smoker    Packs/day: 0.50    Years: 5.00    Types: Cigarettes  . Smokeless tobacco: Current User    Types: Chew  . Alcohol use Yes     Comment: Cocaine few times per year, marijuana once week, but not past 2 months    History reviewed. No pertinent family history.  No Known Allergies  Medication list has been reviewed and updated.  No current outpatient prescriptions on file prior to visit.   No current facility-administered medications on file prior to visit.     ROS ROS otherwise unremarkable unless listed above.  Physical Examination: BP 130/78   Pulse (!) 54   Temp 98.5 F (36.9 C) (Oral)   Resp 18   Ht 5' 5.5" (1.664 m)   Wt 159 lb 9.6 oz (72.4 kg)   SpO2 99%   BMI 26.15 kg/m  Ideal Body Weight: Weight in (lb) to have BMI = 25: 152.2  Physical Exam  Constitutional: He is oriented to person, place, and time. He appears well-developed and well-nourished. No distress.  HENT:  Head: Normocephalic and atraumatic.  Right Ear: Tympanic membrane, external ear and ear canal normal.  Left Ear: Tympanic membrane, external ear and ear  canal normal.  Eyes: Pupils are equal, round, and reactive to light. Conjunctivae and EOM are normal.  Cardiovascular: Normal rate and regular rhythm.  Exam reveals no friction rub.   No murmur heard. Pulmonary/Chest: Effort normal. No respiratory distress. He has no wheezes.  Abdominal: Soft. Bowel sounds are normal. He exhibits no distension and no mass. There is no tenderness.  Musculoskeletal: Normal range of motion. He exhibits no edema or tenderness.  Neurological: He is alert and oriented to person, place, and time. He displays normal reflexes.  Skin: Skin is warm and dry. He is not diaphoretic.  Psychiatric: He has a normal mood and affect. His behavior is normal.     Assessment and Plan: Dennis Foley is a 26 y.o. 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   Advised patient.  He was upset.  Advised that he would need to discontinue use.   Encounter for commercial driver medical examination (CDME)  Trena PlattStephanie Paulo Keimig, PA-C [facility] 9/4/20189:58 AM

## 2017-08-18 ENCOUNTER — Ambulatory Visit (INDEPENDENT_AMBULATORY_CARE_PROVIDER_SITE_OTHER): Payer: 59

## 2017-08-18 ENCOUNTER — Encounter: Payer: Self-pay | Admitting: Urgent Care

## 2017-08-18 ENCOUNTER — Ambulatory Visit: Payer: 59 | Admitting: Urgent Care

## 2017-08-18 VITALS — BP 130/90 | HR 63 | Temp 98.1°F | Resp 18 | Ht 65.5 in | Wt 170.8 lb

## 2017-08-18 DIAGNOSIS — M436 Torticollis: Secondary | ICD-10-CM

## 2017-08-18 DIAGNOSIS — M542 Cervicalgia: Secondary | ICD-10-CM | POA: Diagnosis not present

## 2017-08-18 DIAGNOSIS — Z8261 Family history of arthritis: Secondary | ICD-10-CM | POA: Diagnosis not present

## 2017-08-18 DIAGNOSIS — F1290 Cannabis use, unspecified, uncomplicated: Secondary | ICD-10-CM | POA: Diagnosis not present

## 2017-08-18 DIAGNOSIS — B9789 Other viral agents as the cause of diseases classified elsewhere: Secondary | ICD-10-CM | POA: Diagnosis not present

## 2017-08-18 DIAGNOSIS — J069 Acute upper respiratory infection, unspecified: Secondary | ICD-10-CM | POA: Diagnosis not present

## 2017-08-18 LAB — POCT CBC
Granulocyte percent: 64.6 %G (ref 37–80)
HCT, POC: 44.5 % (ref 43.5–53.7)
Hemoglobin: 15.1 g/dL (ref 14.1–18.1)
Lymph, poc: 1.7 (ref 0.6–3.4)
MCH, POC: 31.7 pg — AB (ref 27–31.2)
MCHC: 34.1 g/dL (ref 31.8–35.4)
MCV: 93 fL (ref 80–97)
MID (cbc): 102 — AB (ref 0–0.9)
MPV: 8.4 fL (ref 0–99.8)
POC Granulocyte: 5.4 (ref 2–6.9)
POC LYMPH PERCENT: 20.6 %L (ref 10–50)
POC MID %: 14.8 %M — AB (ref 0–12)
Platelet Count, POC: 290 10*3/uL (ref 142–424)
RBC: 4.78 M/uL (ref 4.69–6.13)
RDW, POC: 12.4 %
WBC: 8.3 10*3/uL (ref 4.6–10.2)

## 2017-08-18 MED ORDER — BENZONATATE 100 MG PO CAPS: 100.0000 mg | ORAL_CAPSULE | Freq: Three times a day (TID) | ORAL | 0 refills | Status: DC | PRN

## 2017-08-18 MED ORDER — CYCLOBENZAPRINE HCL 5 MG PO TABS: 5.0000 mg | ORAL_TABLET | Freq: Three times a day (TID) | ORAL | 1 refills | Status: DC | PRN

## 2017-08-18 NOTE — Progress Notes (Signed)
MRN: [id number] DOB: Aug 17, 1990  Subjective:   Dennis Foley is a 27 y.o. male presenting for 2 week history of neck pain, neck stiffness. Also has had sinus congestion, productive cough. He is worried about possibly having meningitis or severe illness. Denies trauma, falls. Works in Aeronautical engineer, also drives a truck. Denies doing a lot of heavy lifting. Smokes 1-2 cigarettes. Smokes marijuana daily.  Dennis Foley currently has no medications in their medication list. Also has No Known Allergies.  Dennis Foley denies past medical history and  has a past surgical history that includes Appendectomy.  Objective:   Vitals: BP 130/90   Pulse 63   Temp 98.1 F (36.7 C) (Oral)   Resp 18   Ht 5' 5.5" (1.664 m)   Wt 170 lb 12.8 oz (77.5 kg)   SpO2 97%   BMI 27.99 kg/m   Physical Exam  Constitutional: He is oriented to person, place, and time. He appears well-developed and well-nourished.  HENT:  TM's intact bilaterally, no effusions or erythema. Nasal turbinates pink, nasal passages patent. No sinus tenderness. Oropharynx clear, mucous membranes moist.   Eyes: EOM are normal. Pupils are equal, round, and reactive to light. Right eye exhibits no discharge. Left eye exhibits no discharge.  Neck: Normal range of motion. Neck supple.  Cardiovascular: Normal rate, regular rhythm and intact distal pulses. Exam reveals no gallop and no friction rub.  No murmur heard. Pulmonary/Chest: No respiratory distress. He has no wheezes. He has no rales.  Abdominal: Soft. Bowel sounds are normal. He exhibits no distension and no mass. There is no tenderness. There is no guarding.  Musculoskeletal:       Cervical back: He exhibits decreased range of motion (rotation to the left, lateral flexion), tenderness (with ROM testing) and spasm. He exhibits no bony tenderness, no swelling, no edema, no deformity and no laceration.  Lymphadenopathy:    He has no cervical adenopathy.  Neurological: He is alert and oriented to  person, place, and time. He displays normal reflexes. No cranial nerve deficit. He exhibits normal muscle tone. Coordination normal.  Negative Kernig, Brudzinski.   Skin: Skin is warm and dry. No rash noted.  Psychiatric:  Anxious demeanor.    Dg Cervical Spine Complete  Result Date: 08/18/2017 CLINICAL DATA:  Neck pain and stiffness EXAM: CERVICAL SPINE - COMPLETE 4+ VIEW COMPARISON:  None. FINDINGS: Seven cervical segments are well visualized. Vertebral body height is well maintained. No significant neural foraminal narrowing is seen. No soft tissue abnormality is noted. The odontoid is within normal limits. IMPRESSION: No acute abnormality seen. Electronically Signed   By: Alcide Clever M.D.   On: 08/18/2017 16:04    Results for orders placed or performed in visit on 08/18/17 (from the past 24 hour(s))  POCT CBC     Status: Abnormal   Collection Time: 08/18/17  4:27 PM  Result Value Ref Range   WBC 8.3 4.6 - 10.2 K/uL   Lymph, poc 1.7 0.6 - 3.4   POC LYMPH PERCENT 20.6 10 - 50 %L   MID (cbc) 102 (A) 0 - 0.9   POC MID % 14.8 (A) 0 - 12 %M   POC Granulocyte 5.4 2 - 6.9   Granulocyte percent 64.6 37 - 80 %G   RBC 4.78 4.69 - 6.13 M/uL   Hemoglobin 15.1 14.1 - 18.1 g/dL   HCT, POC 40.9 81.1 - 53.7 %   MCV 93.0 80 - 97 fL   MCH, POC 31.7 (A) 27 - 31.2 pg  MCHC 34.1 31.8 - 35.4 g/dL   RDW, POC 96.012.4 %   Platelet Count, POC 290 142 - 424 K/uL   MPV 8.4 0 - 99.8 fL   Assessment and Plan :   Neck pain - Plan: Comprehensive metabolic panel, POCT CBC, Sedimentation Rate, CYCLIC CITRUL PEPTIDE ANTIBODY, IGG/IGA, DG Cervical Spine Complete  Neck stiffness - Plan: Comprehensive metabolic panel, POCT CBC, Sedimentation Rate, CYCLIC CITRUL PEPTIDE ANTIBODY, IGG/IGA, DG Cervical Spine Complete  Family history of rheumatoid arthritis - Plan: CYCLIC CITRUL PEPTIDE ANTIBODY, IGG/IGA  Viral URI with cough  Reassurance provided, labs pending. Will use conservative management for what I suspect  is musculoskeletal type pain. Start supportive care for viral URI. Return-to-clinic precautions discussed, patient verbalized understanding.   Wallis BambergMario Steel Kerney, PA-C [facility] [phone] 08/18/2017  3:34 PM

## 2017-08-18 NOTE — Patient Instructions (Addendum)
You may take 500mg  Tylenol with ibuprofen 400-600mg  every 6 hours for pain and inflammation. For sore throat try using a honey-based tea. Use 3 teaspoons of honey with juice squeezed from half lemon. Place shaved pieces of ginger into 1/2-1 cup of water and warm over stove top. Then mix the ingredients and repeat every 4 hours as needed.     IF you received an x-ray today, you will receive an invoice from [facility]. Please contact [facility] at [phone] with questions or concerns regarding your invoice.   IF you received labwork today, you will receive an invoice from [facility]. Please contact [facility] at [phone] with questions or concerns regarding your invoice.   Our billing staff will not be able to assist you with questions regarding bills from these companies.  You will be contacted with the lab results as soon as they are available. The fastest way to get your results is to activate your My Chart account. Instructions are located on the last page of this paperwork. If you have not heard from us regarding the results in 2 weeks, please contact this office.

## 2017-08-19 LAB — COMPREHENSIVE METABOLIC PANEL
ALT: 24 IU/L (ref 0–44)
AST: 21 IU/L (ref 0–40)
Albumin/Globulin Ratio: 1.8 (ref 1.2–2.2)
Albumin: 4.8 g/dL (ref 3.5–5.5)
Alkaline Phosphatase: 39 IU/L (ref 39–117)
BUN/Creatinine Ratio: 12 (ref 9–20)
BUN: 11 mg/dL (ref 6–20)
Bilirubin Total: 0.3 mg/dL (ref 0.0–1.2)
CO2: 23 mmol/L (ref 20–29)
Calcium: 9.5 mg/dL (ref 8.7–10.2)
Chloride: 101 mmol/L (ref 96–106)
Creatinine, Ser: 0.89 mg/dL (ref 0.76–1.27)
GFR calc Af Amer: 135 mL/min/{1.73_m2} (ref >59)
GFR calc non Af Amer: 117 mL/min/{1.73_m2} (ref >59)
Globulin, Total: 2.7 g/dL (ref 1.5–4.5)
Glucose: 91 mg/dL (ref 65–99)
Potassium: 3.8 mmol/L (ref 3.5–5.2)
Sodium: 141 mmol/L (ref 134–144)
Total Protein: 7.5 g/dL (ref 6.0–8.5)

## 2017-08-19 LAB — CYCLIC CITRUL PEPTIDE ANTIBODY, IGG/IGA: Cyclic Citrullin Peptide Ab: 6 units (ref 0–19)

## 2017-08-19 LAB — SEDIMENTATION RATE: Sed Rate: 3 mm/hr (ref 0–15)

## 2017-08-24 ENCOUNTER — Encounter: Payer: Self-pay | Admitting: Urgent Care

## 2017-11-06 ENCOUNTER — Encounter: Payer: Self-pay | Admitting: Physician Assistant

## 2017-11-16 ENCOUNTER — Emergency Department (HOSPITAL_COMMUNITY)
Admission: EM | Admit: 2017-11-16 | Discharge: 2017-11-16 | Disposition: A | Discharge status: Home / Self Care | Payer: 59 | Attending: Emergency Medicine | Admitting: Emergency Medicine

## 2017-11-16 ENCOUNTER — Other Ambulatory Visit: Payer: Self-pay

## 2017-11-16 ENCOUNTER — Emergency Department (HOSPITAL_COMMUNITY): Payer: 59

## 2017-11-16 DIAGNOSIS — R109 Unspecified abdominal pain: Secondary | ICD-10-CM | POA: Diagnosis not present

## 2017-11-16 DIAGNOSIS — F17210 Nicotine dependence, cigarettes, uncomplicated: Secondary | ICD-10-CM | POA: Insufficient documentation

## 2017-11-16 DIAGNOSIS — R1031 Right lower quadrant pain: Secondary | ICD-10-CM | POA: Diagnosis not present

## 2017-11-16 DIAGNOSIS — R1011 Right upper quadrant pain: Secondary | ICD-10-CM | POA: Diagnosis not present

## 2017-11-16 LAB — URINALYSIS, ROUTINE W REFLEX MICROSCOPIC
BILIRUBIN URINE: NEGATIVE
GLUCOSE, UA: NEGATIVE mg/dL
HGB URINE DIPSTICK: NEGATIVE
Ketones, ur: NEGATIVE mg/dL
Leukocytes, UA: NEGATIVE
Nitrite: NEGATIVE
PROTEIN: NEGATIVE mg/dL
Specific Gravity, Urine: 1.018 (ref 1.005–1.030)
pH: 5 (ref 5.0–8.0)

## 2017-11-16 LAB — CBC WITH DIFFERENTIAL/PLATELET
Basophils Absolute: 0 10*3/uL (ref 0.0–0.1)
Basophils Relative: 0 %
EOS ABS: 0.2 10*3/uL (ref 0.0–0.7)
EOS PCT: 1 %
HCT: 46 % (ref 39.0–52.0)
Hemoglobin: 15.6 g/dL (ref 13.0–17.0)
LYMPHS ABS: 2.1 10*3/uL (ref 0.7–4.0)
LYMPHS PCT: 15 %
MCH: 31.2 pg (ref 26.0–34.0)
MCHC: 33.9 g/dL (ref 30.0–36.0)
MCV: 92 fL (ref 78.0–100.0)
MONOS PCT: 11 %
Monocytes Absolute: 1.5 10*3/uL — ABNORMAL HIGH (ref 0.1–1.0)
Neutro Abs: 10.3 10*3/uL — ABNORMAL HIGH (ref 1.7–7.7)
Neutrophils Relative %: 73 %
PLATELETS: 314 10*3/uL (ref 150–400)
RBC: 5 MIL/uL (ref 4.22–5.81)
RDW: 12.8 % (ref 11.5–15.5)
WBC: 14.1 10*3/uL — AB (ref 4.0–10.5)

## 2017-11-16 LAB — BASIC METABOLIC PANEL
Anion gap: 10 (ref 5–15)
BUN: 17 mg/dL (ref 6–20)
CO2: 24 mmol/L (ref 22–32)
Calcium: 9.6 mg/dL (ref 8.9–10.3)
Chloride: 106 mmol/L (ref 101–111)
Creatinine, Ser: 0.85 mg/dL (ref 0.61–1.24)
GFR calc Af Amer: >60 mL/min (ref >60)
GFR calc non Af Amer: >60 mL/min (ref >60)
Glucose, Bld: 113 mg/dL — ABNORMAL HIGH (ref 65–99)
Potassium: 4.2 mmol/L (ref 3.5–5.1)
Sodium: 140 mmol/L (ref 135–145)

## 2017-11-16 MED ORDER — ONDANSETRON HCL 4 MG/2ML IJ SOLN
4.0000 mg | Freq: Once | INTRAMUSCULAR | Status: AC
  Administered 2017-11-16: 4 mg via INTRAVENOUS
  Filled 2017-11-16: qty 2

## 2017-11-16 MED ORDER — MORPHINE SULFATE (PF) 4 MG/ML IV SOLN
4.0000 mg | Freq: Once | INTRAVENOUS | Status: AC
  Administered 2017-11-16: 4 mg via INTRAVENOUS
  Filled 2017-11-16: qty 1

## 2017-11-16 MED ORDER — SODIUM CHLORIDE 0.9 % IV BOLUS
1000.0000 mL | Freq: Once | INTRAVENOUS | Status: AC
  Administered 2017-11-16: 1000 mL via INTRAVENOUS

## 2017-11-16 MED ORDER — CYCLOBENZAPRINE HCL 10 MG PO TABS: 10.0000 mg | ORAL_TABLET | Freq: Two times a day (BID) | ORAL | 0 refills | Status: DC | PRN

## 2017-11-16 MED ORDER — KETOROLAC TROMETHAMINE 30 MG/ML IJ SOLN
15.0000 mg | Freq: Once | INTRAMUSCULAR | Status: AC
  Administered 2017-11-16: 15 mg via INTRAVENOUS
  Filled 2017-11-16: qty 1

## 2017-11-16 MED ORDER — NAPROXEN 375 MG PO TABS: 375.0000 mg | ORAL_TABLET | Freq: Two times a day (BID) | ORAL | 0 refills | Status: DC

## 2017-11-16 NOTE — Discharge Instructions (Signed)
Workup has been reassuring in the emergency department.  Your urine did not show any signs of infection.  Your CAT scan shows possibly recently passed kidney stone.  Do have signs of constipation.  Would recommend taking over-the-counter MiraLAX to help regulate her bowels.  Please take the Naproxen as prescribed for pain. Do not take any additional NSAIDs including Motrin, Aleve, Ibuprofen, Advil.  Have been given your first dose in the ED today.  Do not take this to later this afternoon.  Please the the flexeril for muscle relaxation. This medication will make you drowsy so avoid situation that could place you in danger.   If you develop any worsening symptoms including testicular pain, testicular swelling, worsening pain or vomiting return to the ED for evaluation.

## 2017-11-16 NOTE — ED Provider Notes (Signed)
[facility]-EMERGENCY DEPT Provider Note   CSN: [id number] Arrival date & time: 11/16/17  0307     History   Chief Complaint Chief Complaint  Patient presents with  . Flank Pain    HPI Dennis Foley is a 27 y.o. male.  HPI 27 year old Caucasian male past medical history significant for appendectomy presents to the emergency department today for evaluation of right flank pain.  Patient states the pain awoke him from sleep this morning.  He states the pain radiates from his right flank to his right lower quadrant.  Patient states that the pain is constant but the intensity is intermittent.  Describes the pain is sharp in nature.  Patient denies any associated nausea or emesis.  Denies any change in his bowel habits including melena or hematochezia.  Denies any urinary symptoms or testicular pain or swelling.  No known injuries.  Reports family history of kidney stones but denies any kidney stones himself.  Patient did not taking medications for his symptoms prior to arrival.  Nothing makes better or worse.  Patient denies any associated fevers or chills.  Denies any discharge.  Pt denies any fever, chill, ha, vision changes, lightheadedness, dizziness, congestion, neck pain, cp, sob, cough, urinary symptoms, change in bowel habits, melena, hematochezia, lower extremity paresthesias.  No past medical history on file.  There are no active problems to display for this patient.   Past Surgical History:  Procedure Laterality Date  . APPENDECTOMY          Home Medications    Prior to Admission medications   Medication Sig Start Date End Date Taking? Authorizing Provider  benzonatate (TESSALON) 100 MG capsule Take 1-2 capsules (100-200 mg total) by mouth 3 (three) times daily as needed. Patient not taking: Reported on 11/16/2017 08/18/17   Wallis BambergMani, Mario, PA-C  cyclobenzaprine (FLEXERIL) 10 MG tablet Take 1 tablet (10 mg total) by mouth 2 (two) times daily as needed.  11/16/17   Rise MuLeaphart, Kenneth T, PA-C  naproxen (NAPROSYN) 375 MG tablet Take 1 tablet (375 mg total) by mouth 2 (two) times daily. 11/16/17   Rise MuLeaphart, Kenneth T, PA-C    Family History No family history on file.  Social History Social History   Tobacco Use  . Smoking status: Current Every Day Smoker    Packs/day: 0.50    Years: 5.00    Pack years: 2.50    Types: Cigarettes  . Smokeless tobacco: Current User    Types: Chew  Substance Use Topics  . Alcohol use: Yes    Comment: Cocaine few times per year, marijuana once week, but not past 2 months  . Drug use: Yes    Types: Marijuana, Cocaine     Allergies   Patient has no known allergies.   Review of Systems Review of Systems  All other systems reviewed and are negative.    Physical Exam Updated Vital Signs BP 124/85   Pulse 63   Resp 16   Ht 5\' 6"  (1.676 m)   Wt 72.6 kg (160 lb)   SpO2 100%   BMI 25.82 kg/m   Physical Exam  Constitutional: He is oriented to person, place, and time. He appears well-developed and well-nourished.  Non-toxic appearance. No distress.  Playing on cell phone when I walk into room  HENT:  Head: Normocephalic and atraumatic.  Mouth/Throat: Oropharynx is clear and moist.  Eyes: Pupils are equal, round, and reactive to light. Conjunctivae are normal. Right eye exhibits no discharge. Left eye  exhibits no discharge. No scleral icterus.  Neck: Normal range of motion. Neck supple.  Cardiovascular: Normal rate, regular rhythm, normal heart sounds and intact distal pulses. Exam reveals no gallop and no friction rub.  No murmur heard. Pulmonary/Chest: Effort normal and breath sounds normal. No stridor. No respiratory distress. He has no wheezes. He has no rales. He exhibits no tenderness.  Abdominal: Soft. Bowel sounds are normal. He exhibits no distension. There is tenderness in the right lower quadrant. There is no rigidity, no rebound, no guarding, no CVA tenderness, no tenderness at  McBurney's point and negative Murphy's sign.  Musculoskeletal: Normal range of motion. He exhibits no tenderness.  No midline T spine or L spine tenderness. No deformities or step offs noted. Full ROM. Pelvis is stable.   Lymphadenopathy:    He has no cervical adenopathy.  Neurological: He is alert and oriented to person, place, and time.  Skin: Skin is warm and dry. Capillary refill takes less than 2 seconds. No rash noted. No pallor.  Psychiatric: His behavior is normal. Judgment and thought content normal.  Nursing note and vitals reviewed.    ED Treatments / Results  Labs (all labs ordered are listed, but only abnormal results are displayed) Labs Reviewed  BASIC METABOLIC PANEL - Abnormal; Notable for the following components:      Result Value   Glucose, Bld 113 (*)    All other components within normal limits  CBC WITH DIFFERENTIAL/PLATELET - Abnormal; Notable for the following components:   WBC 14.1 (*)    Neutro Abs 10.3 (*)    Monocytes Absolute 1.5 (*)    All other components within normal limits  URINALYSIS, ROUTINE W REFLEX MICROSCOPIC    EKG None  Radiology Ct Renal Stone Study  Result Date: 11/16/2017 CLINICAL DATA:  Right flank pain. EXAM: CT ABDOMEN AND PELVIS WITHOUT CONTRAST TECHNIQUE: Multidetector CT imaging of the abdomen and pelvis was performed following the standard protocol without IV contrast. COMPARISON:  None. FINDINGS: Lower chest: Mild subsegmental left lung base atelectasis. No pleural fluid. Hepatobiliary: No focal liver abnormality is seen. No gallstones, gallbladder wall thickening, or biliary dilatation. Pancreas: No ductal dilatation or inflammation. Spleen: Normal in size without focal abnormality. Adrenals/Urinary Tract: No adrenal nodule. Mild right pelvicaliectasis without frank hydronephrosis. No left hydronephrosis. No urolithiasis. No perinephric edema. Urinary bladder is partially distended. No bladder stone or wall thickening. No  urethral stone visualized. Stomach/Bowel: Moderate stool in the proximal colon without colonic wall thickening. No small bowel inflammation or obstruction. There is fecalization of distal small bowel contents. Appendix tentatively identified, no evidence appendicitis. Vascular/Lymphatic: Normal course and caliber of abdominal vessels. Few prominent ileocolic nodes are likely reactive. No enlarged abdominal or pelvic lymph nodes. Reproductive: Prostate is unremarkable. Other: No free air, free fluid, or intra-abdominal fluid collection. Musculoskeletal: There are no acute or suspicious osseous abnormalities. IMPRESSION: 1. Mild right pelvicaliectasis without frank hydronephrosis. This may be spurious or secondary to recently passed stone. No nonobstructing calculi. 2. Moderate stool burden in the proximal colon. Fecalization of distal small bowel contents. Skin be seen with constipation/slow transit or small intestinal bacterial overgrowth. Electronically Signed   By: Rubye Oaks M.D.   On: 11/16/2017 06:44    Procedures Procedures (including critical care time)  Medications Ordered in ED Medications  ketorolac (TORADOL) 30 MG/ML injection 15 mg (has no administration in time range)  sodium chloride 0.9 % bolus 1,000 mL (1,000 mLs Intravenous New Bag/Given 11/16/17 0511)  morphine 4 MG/ML  injection 4 mg (4 mg Intravenous Given 11/16/17 0511)  ondansetron (ZOFRAN) injection 4 mg (4 mg Intravenous Given 11/16/17 0511)     Initial Impression / Assessment and Plan / ED Course  I have reviewed the triage vital signs and the nursing notes.  Pertinent labs & imaging results that were available during my care of the patient were reviewed by me and considered in my medical decision making (see chart for details).     Patient presents to the ED with complaints of right flank pain that radiates to his right abdomen.  Onset this morning.  Denies any other associated symptoms including urinary symptoms,  fever, vomiting, change in bowel habits, testicular pain or swelling, penile discharge.  Patient overall well-appearing and nontoxic.  Vital signs are reassuring.  Patient is afebrile in the ED.  No hypotension or tachycardia noted.  Patient has no CVA tenderness on exam.  Mild right lower quadrant pain to palpation.  No signs of peritonitis.  Bowel sounds are normal.  Lab work reveals a leukocytosis of 14,000.  Otherwise lab work is reassuring.  Electrolytes are reassuring.  UA shows no signs of infection.  CT renal study shows signs of possible recently passed stone.  Does note some possible constipation but no other acute abnormalities were noted.  Patient with history of appendectomy.  Unknown cause of patient's symptoms.  May be related to his constipation or recently passed kidney stone.  Patient's pain managed in the ED.  Patient tolerating p.o. fluids.  Vital signs remained reassuring.  Will give patient anti-inflammatories, muscle relaxers as suspect possible muscular skeletal pain.  Encouraged MiraLAX to help with constipation.  Discussed that if symptoms persist to follow with primary care doctor return the ED.  Pt is hemodynamically stable, in NAD, & able to ambulate in the ED. Evaluation does not show pathology that would require ongoing emergent intervention or inpatient treatment. I explained the diagnosis to the patient. Pain has been managed & has no complaints prior to dc. Pt is comfortable with above plan and is stable for discharge at this time. All questions were answered prior to disposition. Strict return precautions for f/u to the ED were discussed. Encouraged follow up with PCP.  Pt dicussed with Dr. Erma Heritage who is agreeable with the above plan.   Final Clinical Impressions(s) / ED Diagnoses   Final diagnoses:  Right flank pain    ED Discharge Orders        Ordered    cyclobenzaprine (FLEXERIL) 10 MG tablet  2 times daily PRN     11/16/17 0705    naproxen (NAPROSYN)  375 MG tablet  2 times daily     11/16/17 0705       Rise Mu, PA-C 11/16/17 4098    Shaune Pollack, MD 11/16/17 [phone]

## 2017-11-16 NOTE — ED Triage Notes (Signed)
Pt arriving from home with complaint of right sided flank pain that radiates to his back.

## 2017-11-16 NOTE — ED Notes (Signed)
Bed: WA02 Expected date:  Expected time:  Means of arrival:  Comments: 

## 2018-03-20 ENCOUNTER — Ambulatory Visit: Payer: 59 | Admitting: Urgent Care

## 2018-03-20 ENCOUNTER — Encounter: Payer: Self-pay | Admitting: Urgent Care

## 2018-03-20 VITALS — BP 124/73 | HR 61 | Temp 98.2°F | Resp 16 | Ht 66.0 in | Wt 166.6 lb

## 2018-03-20 DIAGNOSIS — M79674 Pain in right toe(s): Secondary | ICD-10-CM

## 2018-03-20 DIAGNOSIS — M79675 Pain in left toe(s): Secondary | ICD-10-CM

## 2018-03-20 DIAGNOSIS — S90932A Unspecified superficial injury of left great toe, initial encounter: Secondary | ICD-10-CM

## 2018-03-20 DIAGNOSIS — T07XXXA Unspecified multiple injuries, initial encounter: Secondary | ICD-10-CM

## 2018-03-20 DIAGNOSIS — S90931A Unspecified superficial injury of right great toe, initial encounter: Secondary | ICD-10-CM | POA: Diagnosis not present

## 2018-03-20 DIAGNOSIS — Z23 Encounter for immunization: Secondary | ICD-10-CM | POA: Diagnosis not present

## 2018-03-20 NOTE — Progress Notes (Signed)
    MRN: [id number] DOB: 1990-08-12  Subjective:   Dennis Foley is a 27 y.o. male presenting for suffering a foot laceration over bottom of his great toes. Cut happened accidentally from metal object at home on Sunday. He has kept his wounds clean and covered. Denies fever, drainage of pus or bleeding. He does feel pain with pressure when he walks. Primarily wants to have his tdap updated. He  reports that he has been smoking cigarettes. He has a 2.50 pack-year smoking history. His smokeless tobacco use includes chew. He reports that he drinks alcohol. He reports that he has current or past drug history. Drugs: Marijuana and Cocaine.   Vickki MuffWeston is not currently taking any medications.  Also has No Known Allergies.  Vickki MuffWeston denies past medical history. Also  has a past surgical history that includes Appendectomy.  Objective:   Vitals: BP 124/73   Pulse 61   Temp 98.2 F (36.8 C) (Oral)   Resp 16   Ht 5\' 6"  (1.676 m)   Wt 166 lb 9.6 oz (75.6 kg)   SpO2 100%   BMI 26.89 kg/m   Physical Exam  Constitutional: He is oriented to person, place, and time. He appears well-developed and well-nourished.  Cardiovascular: Normal rate.  Pulmonary/Chest: Effort normal.  Musculoskeletal:       Feet:  Neurological: He is alert and oriented to person, place, and time.    Assessment and Plan :   Toe pain, bilateral  Need for Tdap vaccination - Plan: Tdap vaccine greater than or equal to 7yo IM  Multiple lacerations  Tdap updated. Wound care reviewed. Follow up as needed.  Wallis BambergMario Harolyn Cocker, PA-C [facility] [phone] 03/20/2018  2:51 PM

## 2018-03-20 NOTE — Patient Instructions (Signed)
  I will contact you with your lab results within the next 2 weeks.  If you have not heard from us then please contact us. The fastest way to get your results is to register for My Chart.   IF you received an x-ray today, you will receive an invoice from [facility]. Please contact [facility] at [phone] with questions or concerns regarding your invoice.   IF you received labwork today, you will receive an invoice from [facility]. Please contact [facility] at [phone] with questions or concerns regarding your invoice.   Our billing staff will not be able to assist you with questions regarding bills from these companies.  You will be contacted with the lab results as soon as they are available. The fastest way to get your results is to activate your My Chart account. Instructions are located on the last page of this paperwork. If you have not heard from us regarding the results in 2 weeks, please contact this office.     

## 2018-04-11 ENCOUNTER — Emergency Department (HOSPITAL_COMMUNITY)
Admission: EM | Admit: 2018-04-11 | Discharge: 2018-04-11 | Disposition: A | Discharge status: Home / Self Care | Payer: 59 | Attending: Emergency Medicine | Admitting: Emergency Medicine

## 2018-04-11 ENCOUNTER — Emergency Department (HOSPITAL_COMMUNITY): Payer: 59

## 2018-04-11 ENCOUNTER — Encounter (HOSPITAL_COMMUNITY): Payer: Self-pay | Admitting: Emergency Medicine

## 2018-04-11 DIAGNOSIS — S43102A Unspecified dislocation of left acromioclavicular joint, initial encounter: Secondary | ICD-10-CM | POA: Insufficient documentation

## 2018-04-11 DIAGNOSIS — S99922A Unspecified injury of left foot, initial encounter: Secondary | ICD-10-CM | POA: Diagnosis not present

## 2018-04-11 DIAGNOSIS — S80212A Abrasion, left knee, initial encounter: Secondary | ICD-10-CM | POA: Insufficient documentation

## 2018-04-11 DIAGNOSIS — S7002XA Contusion of left hip, initial encounter: Secondary | ICD-10-CM | POA: Diagnosis not present

## 2018-04-11 DIAGNOSIS — Y9389 Activity, other specified: Secondary | ICD-10-CM | POA: Insufficient documentation

## 2018-04-11 DIAGNOSIS — R079 Chest pain, unspecified: Secondary | ICD-10-CM | POA: Diagnosis not present

## 2018-04-11 DIAGNOSIS — Y998 Other external cause status: Secondary | ICD-10-CM | POA: Insufficient documentation

## 2018-04-11 DIAGNOSIS — R1032 Left lower quadrant pain: Secondary | ICD-10-CM | POA: Insufficient documentation

## 2018-04-11 DIAGNOSIS — F17210 Nicotine dependence, cigarettes, uncomplicated: Secondary | ICD-10-CM | POA: Insufficient documentation

## 2018-04-11 DIAGNOSIS — R55 Syncope and collapse: Secondary | ICD-10-CM | POA: Diagnosis not present

## 2018-04-11 DIAGNOSIS — T07XXXA Unspecified multiple injuries, initial encounter: Secondary | ICD-10-CM

## 2018-04-11 DIAGNOSIS — S8992XA Unspecified injury of left lower leg, initial encounter: Secondary | ICD-10-CM | POA: Diagnosis not present

## 2018-04-11 DIAGNOSIS — Y92410 Unspecified street and highway as the place of occurrence of the external cause: Secondary | ICD-10-CM | POA: Diagnosis not present

## 2018-04-11 DIAGNOSIS — S4992XA Unspecified injury of left shoulder and upper arm, initial encounter: Secondary | ICD-10-CM | POA: Diagnosis not present

## 2018-04-11 DIAGNOSIS — S299XXA Unspecified injury of thorax, initial encounter: Secondary | ICD-10-CM | POA: Diagnosis not present

## 2018-04-11 DIAGNOSIS — M79675 Pain in left toe(s): Secondary | ICD-10-CM | POA: Insufficient documentation

## 2018-04-11 DIAGNOSIS — S30811A Abrasion of abdominal wall, initial encounter: Secondary | ICD-10-CM | POA: Insufficient documentation

## 2018-04-11 DIAGNOSIS — R109 Unspecified abdominal pain: Secondary | ICD-10-CM | POA: Diagnosis not present

## 2018-04-11 DIAGNOSIS — S79912A Unspecified injury of left hip, initial encounter: Secondary | ICD-10-CM | POA: Diagnosis not present

## 2018-04-11 DIAGNOSIS — R0789 Other chest pain: Secondary | ICD-10-CM | POA: Diagnosis not present

## 2018-04-11 DIAGNOSIS — S20412A Abrasion of left back wall of thorax, initial encounter: Secondary | ICD-10-CM | POA: Insufficient documentation

## 2018-04-11 DIAGNOSIS — S0990XA Unspecified injury of head, initial encounter: Secondary | ICD-10-CM | POA: Diagnosis not present

## 2018-04-11 DIAGNOSIS — S3991XA Unspecified injury of abdomen, initial encounter: Secondary | ICD-10-CM | POA: Diagnosis not present

## 2018-04-11 LAB — CBC WITH DIFFERENTIAL/PLATELET
BASOS ABS: 0.1 10*3/uL (ref 0.0–0.1)
BASOS PCT: 0 %
Eosinophils Absolute: 0.1 10*3/uL (ref 0.0–0.7)
Eosinophils Relative: 1 %
HEMATOCRIT: 43.2 % (ref 39.0–52.0)
HEMOGLOBIN: 15.1 g/dL (ref 13.0–17.0)
Lymphocytes Relative: 9 %
Lymphs Abs: 2.1 10*3/uL (ref 0.7–4.0)
MCH: 31.7 pg (ref 26.0–34.0)
MCHC: 35 g/dL (ref 30.0–36.0)
MCV: 90.8 fL (ref 78.0–100.0)
Monocytes Absolute: 1.8 10*3/uL — ABNORMAL HIGH (ref 0.1–1.0)
Monocytes Relative: 8 %
NEUTROS ABS: 19.8 10*3/uL — AB (ref 1.7–7.7)
NEUTROS PCT: 82 %
Platelets: 347 10*3/uL (ref 150–400)
RBC: 4.76 MIL/uL (ref 4.22–5.81)
RDW: 12.4 % (ref 11.5–15.5)
WBC: 23.9 10*3/uL — AB (ref 4.0–10.5)

## 2018-04-11 LAB — I-STAT CHEM 8, ED
BUN: 21 mg/dL — AB (ref 6–20)
CREATININE: 1.3 mg/dL — AB (ref 0.61–1.24)
Calcium, Ion: 1.2 mmol/L (ref 1.15–1.40)
Chloride: 109 mmol/L (ref 98–111)
Glucose, Bld: 91 mg/dL (ref 70–99)
HEMATOCRIT: 45 % (ref 39.0–52.0)
HEMOGLOBIN: 15.3 g/dL (ref 13.0–17.0)
POTASSIUM: 4.1 mmol/L (ref 3.5–5.1)
Sodium: 144 mmol/L (ref 135–145)
TCO2: 26 mmol/L (ref 22–32)

## 2018-04-11 MED ORDER — HYDROCODONE-ACETAMINOPHEN 5-325 MG PO TABS: 1.0000 | ORAL_TABLET | ORAL | 0 refills | Status: DC | PRN

## 2018-04-11 MED ORDER — IOPAMIDOL (ISOVUE-300) INJECTION 61%
100.0000 mL | Freq: Once | INTRAVENOUS | Status: AC | PRN
  Administered 2018-04-11: 100 mL via INTRAVENOUS

## 2018-04-11 MED ORDER — IOPAMIDOL (ISOVUE-300) INJECTION 61%
INTRAVENOUS | Status: AC
  Administered 2018-04-11: 100 mL via INTRAVENOUS
  Filled 2018-04-11: qty 100

## 2018-04-11 MED ORDER — SODIUM CHLORIDE 0.9 % IV BOLUS
500.0000 mL | Freq: Once | INTRAVENOUS | Status: AC
  Administered 2018-04-11: 500 mL via INTRAVENOUS
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
## 2018-04-11 NOTE — ED Notes (Signed)
Called ortho tech for arm sling. 

## 2018-04-12 DIAGNOSIS — S43102A Unspecified dislocation of left acromioclavicular joint, initial encounter: Secondary | ICD-10-CM | POA: Diagnosis not present

## 2018-05-10 DIAGNOSIS — S43102D Unspecified dislocation of left acromioclavicular joint, subsequent encounter: Secondary | ICD-10-CM | POA: Diagnosis not present
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
## 2020-10-16 ENCOUNTER — Other Ambulatory Visit: Payer: Self-pay

## 2020-10-16 ENCOUNTER — Encounter: Payer: Self-pay | Admitting: Emergency Medicine

## 2020-10-16 ENCOUNTER — Ambulatory Visit
Admission: EM | Admit: 2020-10-16 | Discharge: 2020-10-16 | Disposition: A | Discharge status: Home / Self Care | Payer: Self-pay | Attending: Family Medicine | Admitting: Family Medicine

## 2020-10-16 DIAGNOSIS — B349 Viral infection, unspecified: Secondary | ICD-10-CM | POA: Insufficient documentation

## 2020-10-16 DIAGNOSIS — Z20822 Contact with and (suspected) exposure to covid-19: Secondary | ICD-10-CM

## 2020-10-16 DIAGNOSIS — J029 Acute pharyngitis, unspecified: Secondary | ICD-10-CM | POA: Insufficient documentation

## 2020-10-16 LAB — POCT RAPID STREP A (OFFICE): Rapid Strep A Screen: NEGATIVE

## 2020-10-16 MED ORDER — FLUTICASONE PROPIONATE 50 MCG/ACT NA SUSP: 2.0000 | Freq: Every day | NASAL | 0 refills | Status: DC

## 2020-10-16 MED ORDER — PREDNISONE 20 MG PO TABS: 20.0000 mg | ORAL_TABLET | Freq: Every day | ORAL | 0 refills | Status: AC

## 2020-10-16 MED ORDER — CETIRIZINE HCL 10 MG PO TABS: 10.0000 mg | ORAL_TABLET | Freq: Every day | ORAL | 0 refills | Status: DC

## 2020-10-16 NOTE — ED Triage Notes (Signed)
Pt here for sore throat and body aches with nasal congestion x 2 days

## 2020-10-16 NOTE — Discharge Instructions (Addendum)
Your COVID 19 results should result within 3-5 days. Negative results are immediately resulted to Mychart.  Only positive test results are abnormal throat culture receive a call from our office.    Rapid strep is negative.  Throat culture is pending.  In the meantime I am prescribing prednisone 20 mg once daily for 5 days to help with throat swelling and irritation.  Treating nasal symptoms with Flonase 2 sprays daily in the morning.  Cetirizine 10 mg at bedtime to help with congestion in nasal symptoms.  Alternate Tylenol and ibuprofen as needed for body aches and fever.  Symptom management per recommendations discussed today.  If any breathing difficulty or chest pain develops go immediately to the closest emergency department for evaluation.

## 2020-10-16 NOTE — ED Provider Notes (Signed)
[facility]    CSN: [id number] Arrival date & time: 10/16/20  0809      History   Chief Complaint No chief complaint on file.   HPI Dennis Foley is a 30 y.o. male.   HPI    Patient presents with URI symptoms including cough, sore throat, nasal congestion, runny nose, bodyaches and sinus pressure. Unknown of sick contact. Reports noticed x 1 day throat swollen with red spots with white patches on throat.  Suffers from chronic seasonal allergies.  Has not taken any medication for current symptoms.  Denies worrisome symptoms of shortness of breath, weakness, N&V, chest pain or leg pain.   No past medical history on file.  There are no problems to display for this patient.   Past Surgical History:  Procedure Laterality Date  . APPENDECTOMY         Home Medications    Prior to Admission medications   Medication Sig Start Date End Date Taking? Authorizing Provider  HYDROcodone-acetaminophen (NORCO/VICODIN) 5-325 MG tablet Take 1-2 tablets by mouth every 4 (four) hours as needed. 04/11/18   Benjiman Core, MD    Family History No family history on file.  Social History Social History   Tobacco Use  . Smoking status: Current Every Day Smoker    Packs/day: 0.50    Years: 5.00    Pack years: 2.50    Types: Cigarettes  . Smokeless tobacco: Current User    Types: Chew  Vaping Use  . Vaping Use: Never used  Substance Use Topics  . Alcohol use: Yes    Comment: Cocaine few times per year, marijuana once week, but not past 2 months  . Drug use: Yes    Types: Marijuana, Cocaine     Allergies   Patient has no known allergies.   Review of Systems Review of Systems Pertinent negatives listed in HPI   Physical Exam Triage Vital Signs ED Triage Vitals [10/16/20 0823]  Enc Vitals Group     BP 120/83     Pulse Rate 66     Resp 20     Temp 97.7 F (36.5 C)     Temp Source Oral     SpO2 97 %     Weight      Height      Head Circumference       Peak Flow      Pain Score      Pain Loc      Pain Edu?      Excl. in GC?    No data found.  Updated Vital Signs BP 120/83 (BP Location: Left Arm)   Pulse 66   Temp 97.7 F (36.5 C) (Oral)   Resp 20   SpO2 97%   Visual Acuity Right Eye Distance:   Left Eye Distance:   Bilateral Distance:    Right Eye Near:   Left Eye Near:    Bilateral Near:     Physical Exam  General Appearance:    Alert, cooperative, no distress  HENT:   Normocephalic, ears normal, nares mucosal edema with congestion, rhinorrhea, oropharynx erythematous with uvula edema/ exudate located uvula   Eyes:    PERRL, conjunctiva/corneas clear, EOM's intact       Lungs:     Clear to auscultation bilaterally, respirations unlabored  Heart:    Regular rate and rhythm  Neurologic:   Awake, alert, oriented x 3. No apparent focal neurological  defect.     UC Treatments / Results  Labs (all labs ordered are listed, but only abnormal results are displayed) Labs Reviewed - No data to display  EKG   Radiology No results found.  Procedures Procedures (including critical care time)  Medications Ordered in UC Medications - No data to display  Initial Impression / Assessment and Plan / UC Course  I have reviewed the triage vital signs and the nursing notes.  Pertinent labs & imaging results that were available during my care of the patient were reviewed by me and considered in my medical decision making (see chart for details).     COVID pending.  Throat culture pending.  Treatment for viral illness today.  Conservative treatment with prednisone 20 mg once daily for throat swelling and irritation.  For nasal symptoms cetirizine 10 mg once daily at bedtime along with Flonase 2 sprays daily in the morning.  If any additional treatment is warranted will be notified via phone.  Final Clinical Impressions(s) / UC Diagnoses   Final diagnoses:  Encounter for screening laboratory testing for COVID-19  virus  Acute pharyngitis, unspecified etiology  Viral illness   Discharge Instructions   None    ED Prescriptions    Medication Sig Dispense Auth. Provider   cetirizine (ZYRTEC) 10 MG tablet Take 1 tablet (10 mg total) by mouth daily. 30 tablet Bing Neighbors, FNP   fluticasone (FLONASE) 50 MCG/ACT nasal spray Place 2 sprays into both nostrils daily. 16 g Bing Neighbors, FNP   predniSONE (DELTASONE) 20 MG tablet Take 1 tablet (20 mg total) by mouth daily with breakfast for 5 days. 5 tablet Bing Neighbors, FNP     PDMP not reviewed this encounter.   Bing Neighbors, [place] 10/16/20 [phone]

## 2020-10-17 LAB — SARS-COV-2, NAA 2 DAY TAT

## 2020-10-17 LAB — NOVEL CORONAVIRUS, NAA: SARS-CoV-2, NAA: NOT DETECTED

## 2020-10-19 LAB — CULTURE, GROUP A STREP (THRC)

## 2021-05-11 ENCOUNTER — Ambulatory Visit (HOSPITAL_COMMUNITY)
Admission: EM | Admit: 2021-05-11 | Discharge: 2021-05-11 | Disposition: A | Discharge status: Home / Self Care | Payer: 59 | Attending: Student | Admitting: Student

## 2021-05-11 ENCOUNTER — Encounter (HOSPITAL_COMMUNITY): Payer: Self-pay | Admitting: Emergency Medicine

## 2021-05-11 ENCOUNTER — Other Ambulatory Visit: Payer: Self-pay

## 2021-05-11 DIAGNOSIS — H9201 Otalgia, right ear: Secondary | ICD-10-CM | POA: Diagnosis not present

## 2021-05-11 MED ORDER — AMOXICILLIN-POT CLAVULANATE 875-125 MG PO TABS: 1.0000 | ORAL_TABLET | Freq: Two times a day (BID) | ORAL | 0 refills | Status: DC

## 2021-05-11 MED ORDER — PREDNISONE 50 MG PO TABS: 50.0000 mg | ORAL_TABLET | Freq: Every day | ORAL | 0 refills | Status: AC

## 2021-05-11 NOTE — ED Provider Notes (Signed)
[place]    CSN: [id number] Arrival date & time: 05/11/21  0803      History   Chief Complaint Chief Complaint  Patient presents with   Otalgia    Right     HPI Dennis Foley is a 30 y.o. male presenting with right ear pain and congestion for about 2 days.  Medical history noncontributory.  States that he has had severe throbbing right ear pain and right-sided scalp tenderness for about 3 days.  Sometimes the pain is so severe that he has to close his eyes. Pain is worse with head movement. Denies tinnitus or dizziness.  Also with some congestion, but denies sore throat, fever/chills, cough.  Denies headaches or neck pain or vision changes.  HPI  History reviewed. No pertinent past medical history.  There are no problems to display for this patient.   Past Surgical History:  Procedure Laterality Date   APPENDECTOMY         Home Medications    Prior to Admission medications   Medication Sig Start Date End Date Taking? Authorizing Provider  amoxicillin-clavulanate (AUGMENTIN) 875-125 MG tablet Take 1 tablet by mouth every 12 (twelve) hours. 05/11/21  Yes Hazel Sams, PA-C  predniSONE (DELTASONE) 50 MG tablet Take 1 tablet (50 mg total) by mouth daily for 5 days. 05/11/21 05/16/21 Yes Hazel Sams, PA-C    Family History History reviewed. No pertinent family history.  Social History Social History   Tobacco Use   Smoking status: Every Day    Packs/day: 0.50    Years: 5.00    Pack years: 2.50    Types: Cigarettes   Smokeless tobacco: Current    Types: Chew  Vaping Use   Vaping Use: Never used  Substance Use Topics   Alcohol use: Yes    Comment: Cocaine few times per year, marijuana once week, but not past 2 months   Drug use: Yes    Types: Marijuana, Cocaine     Allergies   Patient has no known allergies.   Review of Systems Review of Systems  Constitutional:  Negative for appetite change, chills and fever.  HENT:  Positive for  ear pain. Negative for congestion, rhinorrhea, sinus pressure, sinus pain and sore throat.   Eyes:  Negative for redness and visual disturbance.  Respiratory:  Negative for cough, chest tightness, shortness of breath and wheezing.   Cardiovascular:  Negative for chest pain and palpitations.  Gastrointestinal:  Negative for abdominal pain, constipation, diarrhea, nausea and vomiting.  Genitourinary:  Negative for dysuria, frequency and urgency.  Musculoskeletal:  Negative for myalgias.  Neurological:  Negative for dizziness, weakness and headaches.  Psychiatric/Behavioral:  Negative for confusion.   All other systems reviewed and are negative.   Physical Exam Triage Vital Signs ED Triage Vitals  Enc Vitals Group     BP 05/11/21 0821 130/84     Pulse Rate 05/11/21 0821 90     Resp 05/11/21 0821 18     Temp 05/11/21 0821 98.4 F (36.9 C)     Temp src --      SpO2 05/11/21 0821 97 %     Weight --      Height --      Head Circumference --      Peak Flow --      Pain Score 05/11/21 0819 8     Pain Loc --      Pain Edu? --      Excl. in [place]? --  No data found.  Updated Vital Signs BP 130/84   Pulse 90   Temp 98.4 F (36.9 C)   Resp 18   SpO2 97%   Visual Acuity Right Eye Distance: 20/20 (Without correction) Left Eye Distance: 20/20 (Without correction) Bilateral Distance: 20/20 (Without correction)  Right Eye Near:   Left Eye Near:    Bilateral Near:     Physical Exam Vitals reviewed.  Constitutional:      Appearance: Normal appearance. He is not ill-appearing.  HENT:     Head: Normocephalic and atraumatic.     Right Ear: Hearing, tympanic membrane, ear canal and external ear normal. Tenderness present. No swelling. No middle ear effusion. There is no impacted cerumen. No mastoid tenderness. Tympanic membrane is not injected, scarred, perforated, erythematous, retracted or bulging.     Left Ear: Hearing, tympanic membrane, ear canal and external ear normal. No  swelling or tenderness.  No middle ear effusion. There is no impacted cerumen. No mastoid tenderness. Tympanic membrane is not injected, scarred, perforated, erythematous, retracted or bulging.     Ears:     Comments: R canal erythematous and tender, without TM changes, swelling, or mid ear effusion.     Mouth/Throat:     Pharynx: Oropharynx is clear. No oropharyngeal exudate or posterior oropharyngeal erythema.     Comments: Tonsillar enlargement R>L at baseline per pt Cardiovascular:     Rate and Rhythm: Normal rate and regular rhythm.     Heart sounds: Normal heart sounds.  Pulmonary:     Effort: Pulmonary effort is normal.     Breath sounds: Normal breath sounds.  Lymphadenopathy:     Cervical: No cervical adenopathy.     Right cervical: No superficial cervical adenopathy.    Left cervical: No superficial cervical adenopathy.  Neurological:     General: No focal deficit present.     Mental Status: He is alert and oriented to person, place, and time.     Comments: CN 2-12 grossly intact.   Psychiatric:        Mood and Affect: Mood normal.        Behavior: Behavior normal.        Thought Content: Thought content normal.        Judgment: Judgment normal.     UC Treatments / Results  Labs (all labs ordered are listed, but only abnormal results are displayed) Labs Reviewed  SEDIMENTATION RATE  C-REACTIVE PROTEIN    EKG   Radiology No results found.  Procedures Procedures (including critical care time)  Medications Ordered in UC Medications - No data to display  Initial Impression / Assessment and Plan / UC Course  I have reviewed the triage vital signs and the nursing notes.  Pertinent labs & imaging results that were available during my care of the patient were reviewed by me and considered in my medical decision making (see chart for details).     This patient is a very pleasant 30 y.o. year old male presenting with R otalgia. Afebrile, nontachy. Visual acuity  intact. Exam is fairly benign, but patient does have scalp tenderness that raises concern for temporal arteritis. Ddx is otitis externa vs AOM vs temporal arteritis vs early herpes zoster vs other. Will check an ESR and CRP and manage with Augmentin and prednisone 92m x5 days. Patient understands differential and to seek immediate medical attention if symptoms getting worse.   ED return precautions discussed. Patient verbalizes understanding and agreement.   Level 4 given acute  complicated illlnes and prescription drug management.  Final Clinical Impressions(s) / UC Diagnoses   Final diagnoses:  Otalgia of right ear     Discharge Instructions      -Start the antibiotic-Augmentin (amoxicillin-clavulanate), 1 pill every 12 hours for 7 days.  You can take this with food like with breakfast and dinner. -Prednisone, 1 pill taken with breakfast x5 days.  Try taking this earlier in the day as it can give you energy. Avoid NSAIDs like ibuprofen and alleve while taking this medication as they can increase your risk of stomach upset and even GI bleeding when in combination with a steroid. You can continue tylenol (acetaminophen) up to 1059m 3x daily. -We are checking some lab work to make sure that you are not having inflammation of an artery in your forehead, this should come back in about 4 hours and we will call you if this is abnormal.  If it is abnormal, you will have to follow-up with an ear nose and throat doctor. -If lab work is normal but your symptoms still are not improving, follow-up with [place] your primary care.    ED Prescriptions     Medication Sig Dispense Auth. Provider   amoxicillin-clavulanate (AUGMENTIN) 875-125 MG tablet Take 1 tablet by mouth every 12 (twelve) hours. 14 tablet GHazel Sams PA-C   predniSONE (DELTASONE) 50 MG tablet Take 1 tablet (50 mg total) by mouth daily for 5 days. 5 tablet GHazel Sams PA-C      PDMP not reviewed this encounter.   GHazel Sams PA-C 05/11/21 [phone]

## 2021-05-11 NOTE — ED Triage Notes (Signed)
Pt is present today with right ear pain and congestion that started Sunday.

## 2021-05-11 NOTE — Discharge Instructions (Addendum)
-  Start the antibiotic-Augmentin (amoxicillin-clavulanate), 1 pill every 12 hours for 7 days.  You can take this with food like with breakfast and dinner. -Prednisone, 1 pill taken with breakfast x5 days.  Try taking this earlier in the day as it can give you energy. Avoid NSAIDs like ibuprofen and alleve while taking this medication as they can increase your risk of stomach upset and even GI bleeding when in combination with a steroid. You can continue tylenol (acetaminophen) up to 1000mg  3x daily. -We are checking some lab work to make sure that you are not having inflammation of an artery in your forehead, this should come back in about 4 hours and we will call you if this is abnormal.  If it is abnormal, you will have to follow-up with an ear nose and throat doctor. -If lab work is normal but your symptoms still are not improving, follow-up with or your primary care.

## 2021-05-26 ENCOUNTER — Encounter (HOSPITAL_COMMUNITY): Payer: Self-pay | Admitting: Emergency Medicine

## 2021-05-26 ENCOUNTER — Ambulatory Visit (HOSPITAL_COMMUNITY)
Admission: EM | Admit: 2021-05-26 | Discharge: 2021-05-26 | Disposition: A | Discharge status: Home / Self Care | Payer: 59 | Attending: Family Medicine | Admitting: Family Medicine

## 2021-05-26 ENCOUNTER — Other Ambulatory Visit: Payer: Self-pay

## 2021-05-26 DIAGNOSIS — R590 Localized enlarged lymph nodes: Secondary | ICD-10-CM

## 2021-05-26 MED ORDER — DOXYCYCLINE HYCLATE 100 MG PO CAPS: 100.0000 mg | ORAL_CAPSULE | Freq: Two times a day (BID) | ORAL | 0 refills | Status: AC

## 2021-05-26 NOTE — Discharge Instructions (Signed)
As we discussed, this could likely be from an infection.  We will go ahead and treat you with a course of antibiotics, however if this is not improving, or especially if you significantly worsen, you have difficulty breathing, eating, drinking, you develop fever, you should be seen at the emergency room right away because they can do imaging that we are not able to do to look for a deeper infection.  I recommend that you get a primary care provider as well.

## 2021-05-26 NOTE — ED Provider Notes (Signed)
[facility]    CSN: [id number] Arrival date & time: 05/26/21  0801      History   Chief Complaint Chief Complaint  Patient presents with   Neck Pain    HPI Dennis Foley is a 30 y.o. male.   Left-sided neck swelling Patient reports that he was last seen on 10/4 here for an ear infection and was prescribed a course of Augmentin He states that since completing this course about 2 or 3 weeks ago he started to notice that there was a swollen area on the left side of his neck which she thinks is a lymph node Reports that it is tender Sometimes when he is lying down he can feel it pressing on his airway, but does not have difficulty breathing with this, just feels uncomfortable States he has been eating and drinking normally Denies any fevers He is otherwise feeling well He currently denies any congestion, cough, ear pain   History reviewed. No pertinent past medical history.  There are no problems to display for this patient.   Past Surgical History:  Procedure Laterality Date   APPENDECTOMY         Home Medications    Prior to Admission medications   Medication Sig Start Date End Date Taking? Authorizing Provider  doxycycline (VIBRAMYCIN) 100 MG capsule Take 1 capsule (100 mg total) by mouth 2 (two) times daily. 05/26/21  Yes Lennyn Bellanca, Solmon Ice, DO    Family History History reviewed. No pertinent family history.  Social History Social History   Tobacco Use   Smoking status: Every Day    Packs/day: 0.50    Years: 5.00    Pack years: 2.50    Types: Cigarettes   Smokeless tobacco: Current    Types: Chew  Vaping Use   Vaping Use: Never used  Substance Use Topics   Alcohol use: Yes    Comment: Cocaine few times per year, marijuana once week, but not past 2 months   Drug use: Yes    Types: Marijuana, Cocaine     Allergies   Patient has no known allergies.   Review of Systems Review of Systems  All other systems reviewed and are  negative.  Per HPI Physical Exam Triage Vital Signs ED Triage Vitals  Enc Vitals Group     BP      Pulse      Resp      Temp      Temp src      SpO2      Weight      Height      Head Circumference      Peak Flow      Pain Score      Pain Loc      Pain Edu?      Excl. in [place]?    No data found.  Updated Vital Signs BP (!) 143/97 (BP Location: Right Arm)   Pulse 76   Temp 98.1 F (36.7 C) (Oral)   Resp 16   SpO2 97%   Visual Acuity Right Eye Distance:   Left Eye Distance:   Bilateral Distance:    Right Eye Near:   Left Eye Near:    Bilateral Near:     Physical Exam Constitutional:      General: He is not in acute distress.    Appearance: Normal appearance. He is not ill-appearing or toxic-appearing.  HENT:     Head: Normocephalic and atraumatic.     Right  Ear: Tympanic membrane, ear canal and external ear normal.     Left Ear: Tympanic membrane, ear canal and external ear normal.     Nose: Nose normal. No congestion or rhinorrhea.     Mouth/Throat:     Mouth: Mucous membranes are moist.     Palate: No mass.     Pharynx: Oropharynx is clear. Uvula midline. No pharyngeal swelling, oropharyngeal exudate, posterior oropharyngeal erythema or uvula swelling.     Tonsils: No tonsillar exudate or tonsillar abscesses. 1+ on the right. 1+ on the left.  Cardiovascular:     Rate and Rhythm: Normal rate.  Pulmonary:     Effort: Pulmonary effort is normal. No respiratory distress.  Musculoskeletal:     Cervical back: Full passive range of motion without pain, normal range of motion and neck supple. No rigidity or torticollis. Normal range of motion.  Lymphadenopathy:     Cervical: Cervical adenopathy present.     Right cervical: No superficial or posterior cervical adenopathy.    Left cervical: Superficial cervical adenopathy present. No posterior cervical adenopathy.  Skin:    General: Skin is warm and dry.  Neurological:     Mental Status: He is alert and oriented  to person, place, and time.     UC Treatments / Results  Labs (all labs ordered are listed, but only abnormal results are displayed) Labs Reviewed - No data to display  EKG   Radiology No results found.  Procedures Procedures (including critical care time)  Medications Ordered in UC Medications - No data to display  Initial Impression / Assessment and Plan / UC Course  I have reviewed the triage vital signs and the nursing notes.  Pertinent labs & imaging results that were available during my care of the patient were reviewed by me and considered in my medical decision making (see chart for details).     Most likely reactive lymph node, could consider viral, however given the timeframe we will go ahead and treat him for a bacterial cause.  Did discuss with him that if he does not have resolution of his symptoms with this or especially if he significantly worsens, develops fever, difficulty breathing, difficulty swallowing, that he needs to be seen at the emergency room and would likely require imaging to rule out a deeper infection.  Currently he does not have any signs of peritonsillar abscess or deep neck infection.  He was discharged home in stable condition and given a course of doxycycline for 7 days.  Final Clinical Impressions(s) / UC Diagnoses   Final diagnoses:  Anterior cervical lymphadenopathy     Discharge Instructions      As we discussed, this could likely be from an infection.  We will go ahead and treat you with a course of antibiotics, however if this is not improving, or especially if you significantly worsen, you have difficulty breathing, eating, drinking, you develop fever, you should be seen at the emergency room right away because they can do imaging that we are not able to do to look for a deeper infection.  I recommend that you get a primary care provider as well.     ED Prescriptions     Medication Sig Dispense Auth. Provider   doxycycline  (VIBRAMYCIN) 100 MG capsule Take 1 capsule (100 mg total) by mouth 2 (two) times daily. 14 capsule Terron Merfeld, Solmon Ice, DO      PDMP not reviewed this encounter.   Barbara Ahart, Solmon Ice, DO 05/26/21 [phone]

## 2021-05-26 NOTE — ED Triage Notes (Signed)
Pt presents with swollen lymph nodes in neck. States only painful to touch and movement. States uncomfortable to eat or drink.

## 2022-01-09 ENCOUNTER — Ambulatory Visit
Admission: EM | Admit: 2022-01-09 | Discharge: 2022-01-09 | Disposition: A | Discharge status: Home / Self Care | Payer: 59 | Attending: Internal Medicine | Admitting: Internal Medicine

## 2022-01-09 DIAGNOSIS — J029 Acute pharyngitis, unspecified: Secondary | ICD-10-CM | POA: Diagnosis not present

## 2022-01-09 DIAGNOSIS — H109 Unspecified conjunctivitis: Secondary | ICD-10-CM

## 2022-01-09 DIAGNOSIS — J069 Acute upper respiratory infection, unspecified: Secondary | ICD-10-CM | POA: Diagnosis not present

## 2022-01-09 DIAGNOSIS — B9689 Other specified bacterial agents as the cause of diseases classified elsewhere: Secondary | ICD-10-CM | POA: Diagnosis not present

## 2022-01-09 LAB — POCT RAPID STREP A (OFFICE): Rapid Strep A Screen: NEGATIVE

## 2022-01-09 MED ORDER — POLYMYXIN B-TRIMETHOPRIM 10000-0.1 UNIT/ML-% OP SOLN: 1.0000 [drp] | OPHTHALMIC | 0 refills | Status: DC

## 2022-01-09 NOTE — ED Provider Notes (Signed)
[facility]    CSN: [id number] Arrival date & time: 01/09/22  0806      History   Chief Complaint Chief Complaint  Patient presents with   eye irritation    HPI Dennis Foley is a 31 y.o. male.   Patient presents with bilateral eye irritation and drainage, nasal congestion, sore throat that started approximately 4 days ago.  His son has had similar symptoms recently as well as eye symptoms.  Patient states that it feels like there is a film in his eye but is able to see well.  Does not wear contacts.  Denies trauma or foreign body to the eye.  Denies any known fevers at home.  Patient has used over-the-counter eyedrops as well as Tylenol with minimal improvement.  Denies chest pain, shortness of breath, nausea, vomiting, diarrhea, abdominal pain.    No past medical history on file.  There are no problems to display for this patient.   Past Surgical History:  Procedure Laterality Date   APPENDECTOMY         Home Medications    Prior to Admission medications   Medication Sig Start Date End Date Taking? Authorizing Provider  trimethoprim-polymyxin b (POLYTRIM) ophthalmic solution Place 1 drop into both eyes every 4 (four) hours for 7 days. 01/09/22 01/16/22 Yes Norleen Xie, Michele Rockers, FNP  doxycycline (VIBRAMYCIN) 100 MG capsule Take 1 capsule (100 mg total) by mouth 2 (two) times daily. Patient not taking: Reported on 01/09/2022 05/26/21   Meccariello, Bernita Raisin, DO    Family History No family history on file.  Social History Social History   Tobacco Use   Smoking status: Every Day    Packs/day: 0.50    Years: 5.00    Pack years: 2.50    Types: Cigarettes   Smokeless tobacco: Current    Types: Chew  Vaping Use   Vaping Use: Never used  Substance Use Topics   Alcohol use: Yes    Comment: Cocaine few times per year, marijuana once week, but not past 2 months   Drug use: Yes    Types: Marijuana, Cocaine     Allergies   Patient has no known  allergies.   Review of Systems Review of Systems Per HPI  Physical Exam Triage Vital Signs ED Triage Vitals  Enc Vitals Group     BP 01/09/22 0818 117/78     Pulse Rate 01/09/22 0818 78     Resp 01/09/22 0818 18     Temp 01/09/22 0818 (!) 97.3 F (36.3 C)     Temp src --      SpO2 01/09/22 0818 97 %     Weight --      Height --      Head Circumference --      Peak Flow --      Pain Score 01/09/22 0816 2     Pain Loc --      Pain Edu? --      Excl. in [place]? --    No data found.  Updated Vital Signs BP 117/78   Pulse 78   Temp (!) 97.3 F (36.3 C)   Resp 18   SpO2 97%   Visual Acuity Right Eye Distance: 20 Left Eye Distance: 20 Bilateral Distance: 20/20  Right Eye Near:   Left Eye Near:    Bilateral Near:     Physical Exam Constitutional:      General: He is not in acute distress.    Appearance:  Normal appearance. He is not toxic-appearing or diaphoretic.  HENT:     Head: Normocephalic and atraumatic.     Right Ear: Tympanic membrane and ear canal normal.     Left Ear: Tympanic membrane and ear canal normal.     Nose: Congestion present.     Mouth/Throat:     Mouth: Mucous membranes are moist.     Pharynx: Posterior oropharyngeal erythema present.  Eyes:     General: Lids are normal. Lids are everted, no foreign bodies appreciated. Vision grossly intact. Gaze aligned appropriately.     Extraocular Movements: Extraocular movements intact.     Conjunctiva/sclera:     Right eye: Right conjunctiva is injected. Exudate present. No chemosis or hemorrhage.    Left eye: Left conjunctiva is injected. No chemosis, exudate or hemorrhage.    Pupils: Pupils are equal, round, and reactive to light.  Cardiovascular:     Rate and Rhythm: Normal rate and regular rhythm.     Pulses: Normal pulses.     Heart sounds: Normal heart sounds.  Pulmonary:     Effort: Pulmonary effort is normal. No respiratory distress.     Breath sounds: Normal breath sounds. No wheezing.   Abdominal:     General: Abdomen is flat. Bowel sounds are normal.     Palpations: Abdomen is soft.  Musculoskeletal:        General: Normal range of motion.     Cervical back: Normal range of motion.  Skin:    General: Skin is warm and dry.  Neurological:     General: No focal deficit present.     Mental Status: He is alert and oriented to person, place, and time. Mental status is at baseline.  Psychiatric:        Mood and Affect: Mood normal.        Behavior: Behavior normal.     UC Treatments / Results  Labs (all labs ordered are listed, but only abnormal results are displayed) Labs Reviewed  CULTURE, GROUP A STREP [facility])  POCT RAPID STREP A (OFFICE)    EKG   Radiology No results found.  Procedures Procedures (including critical care time)  Medications Ordered in UC Medications - No data to display  Initial Impression / Assessment and Plan / UC Course  I have reviewed the triage vital signs and the nursing notes.  Pertinent labs & imaging results that were available during my care of the patient were reviewed by me and considered in my medical decision making (see chart for details).     Patient presents with symptoms likely from a viral upper respiratory infection. Differential includes bacterial pneumonia, sinusitis, allergic rhinitis, COVID-19, flu. Do not suspect underlying cardiopulmonary process. Symptoms seem unlikely related to ACS, CHF or COPD exacerbations, pneumonia, pneumothorax. Patient is nontoxic appearing and not in need of emergent medical intervention.  Rapid strep was negative.  Throat culture pending.  Patient declined viral testing for COVID.  Recommended symptom control with over the counter medications: Daily oral anti-histamine, Oral decongestant or IN corticosteroid, saline irrigations, cepacol lozenges, Robitussin, Delsym, honey tea.  Polytrim eyedrops for bacterial conjunctivitis.  Visual acuity appears normal.  Return if symptoms fail to  improve in 1-2 weeks or you develop shortness of breath, chest pain, severe headache. Patient states understanding and is agreeable.  Discharged with PCP followup.  Final Clinical Impressions(s) / UC Diagnoses   Final diagnoses:  Bacterial conjunctivitis of both eyes  Viral upper respiratory infection  Sore throat  Discharge Instructions      Rapid strep is negative.  Throat culture is pending.  We will call if it is positive.  It appears that you have a viral upper respiratory infection that should run its course and self resolve in the next few days with symptomatic treatment.  You have been prescribed eyedrops for your pinkeye.  Please change pillowcase and linen daily to prevent reinfection.  Follow-up if symptoms persist or worsen.     ED Prescriptions     Medication Sig Dispense Auth. Provider   trimethoprim-polymyxin b (POLYTRIM) ophthalmic solution Place 1 drop into both eyes every 4 (four) hours for 7 days. 10 mL Teodora Medici, [place]      PDMP not reviewed this encounter.   Teodora Medici, [place] 01/09/22 [phone]

## 2022-01-09 NOTE — Discharge Instructions (Addendum)
Rapid strep is negative.  Throat culture is pending.  We will call if it is positive.  It appears that you have a viral upper respiratory infection that should run its course and self resolve in the next few days with symptomatic treatment.  You have been prescribed eyedrops for your pinkeye.  Please change pillowcase and linen daily to prevent reinfection.  Follow-up if symptoms persist or worsen.

## 2022-01-09 NOTE — ED Triage Notes (Signed)
Patient presents to Urgent Care with complaints of eye drainage, congestion, sore throat since Thursday. Patient reports son was prescribed eye drops.

## 2022-01-10 ENCOUNTER — Telehealth: Payer: Self-pay

## 2022-01-10 MED ORDER — POLYMYXIN B-TRIMETHOPRIM 10000-0.1 UNIT/ML-% OP SOLN: 1.0000 [drp] | OPHTHALMIC | 0 refills | Status: AC

## 2022-01-13 LAB — CULTURE, GROUP A STREP (THRC)
